# Patient Record
Sex: Female | Born: 1985 | Race: Black or African American | Hispanic: No | Marital: Single | Smoking: Never smoker
Health system: Southern US, Community
[De-identification: ages and names within clinical notes are randomized; demographics above are authoritative.]

## PROBLEM LIST (undated history)

## (undated) DIAGNOSIS — G4730 Sleep apnea, unspecified: Secondary | ICD-10-CM

## (undated) DIAGNOSIS — R569 Unspecified convulsions: Secondary | ICD-10-CM

## (undated) HISTORY — PX: NO PAST SURGERIES: SHX2092

---

## 2008-05-01 ENCOUNTER — Emergency Department (HOSPITAL_COMMUNITY): Admission: EM | Admit: 2008-05-01 | Discharge: 2008-05-01 | Payer: Self-pay | Admitting: Emergency Medicine

## 2008-06-24 ENCOUNTER — Emergency Department (HOSPITAL_COMMUNITY): Admission: EM | Admit: 2008-06-24 | Discharge: 2008-06-24 | Payer: Self-pay | Admitting: Emergency Medicine

## 2008-08-29 ENCOUNTER — Emergency Department (HOSPITAL_COMMUNITY): Admission: EM | Admit: 2008-08-29 | Discharge: 2008-08-29 | Payer: Self-pay | Admitting: Emergency Medicine

## 2008-09-16 ENCOUNTER — Emergency Department (HOSPITAL_COMMUNITY): Admission: EM | Admit: 2008-09-16 | Discharge: 2008-09-16 | Payer: Self-pay | Admitting: Emergency Medicine

## 2008-11-25 ENCOUNTER — Emergency Department (HOSPITAL_COMMUNITY): Admission: EM | Admit: 2008-11-25 | Discharge: 2008-11-26 | Payer: Self-pay | Admitting: Emergency Medicine

## 2010-12-12 LAB — POCT PREGNANCY, URINE: Preg Test, Ur: NEGATIVE

## 2010-12-12 LAB — URINALYSIS, ROUTINE W REFLEX MICROSCOPIC
Bilirubin Urine: NEGATIVE
Hgb urine dipstick: NEGATIVE
Protein, ur: NEGATIVE mg/dL
Urobilinogen, UA: 1 mg/dL (ref 0.0–1.0)

## 2010-12-16 LAB — URINALYSIS, ROUTINE W REFLEX MICROSCOPIC
Bilirubin Urine: NEGATIVE
Glucose, UA: NEGATIVE mg/dL
Ketones, ur: NEGATIVE mg/dL
pH: 7.5 (ref 5.0–8.0)

## 2010-12-16 LAB — POCT PREGNANCY, URINE: Preg Test, Ur: NEGATIVE

## 2010-12-16 LAB — URINE MICROSCOPIC-ADD ON

## 2011-06-06 LAB — CBC
HCT: 41 % (ref 36.0–46.0)
Hemoglobin: 13.9 g/dL (ref 12.0–15.0)
MCHC: 34 g/dL (ref 30.0–36.0)
MCV: 91.6 fL (ref 78.0–100.0)
Platelets: 257 10*3/uL (ref 150–400)
RBC: 4.48 MIL/uL (ref 3.87–5.11)
RDW: 12.4 % (ref 11.5–15.5)
WBC: 7.8 10*3/uL (ref 4.0–10.5)

## 2011-06-06 LAB — DIFFERENTIAL
Basophils Relative: 0 % (ref 0–1)
Lymphocytes Relative: 9 % — ABNORMAL LOW (ref 12–46)
Monocytes Absolute: 0.6 10*3/uL (ref 0.1–1.0)
Monocytes Relative: 7 % (ref 3–12)
Neutro Abs: 5.9 10*3/uL (ref 1.7–7.7)
Neutrophils Relative %: 77 % (ref 43–77)

## 2011-06-06 LAB — COMPREHENSIVE METABOLIC PANEL
Albumin: 3.5 g/dL (ref 3.5–5.2)
Alkaline Phosphatase: 68 U/L (ref 39–117)
BUN: 14 mg/dL (ref 6–23)
Calcium: 9 mg/dL (ref 8.4–10.5)
Creatinine, Ser: 0.73 mg/dL (ref 0.4–1.2)
Glucose, Bld: 116 mg/dL — ABNORMAL HIGH (ref 70–99)
Potassium: 3.9 mEq/L (ref 3.5–5.1)
Total Protein: 6.5 g/dL (ref 6.0–8.3)

## 2013-09-27 ENCOUNTER — Encounter (HOSPITAL_COMMUNITY): Payer: Self-pay | Admitting: Emergency Medicine

## 2013-09-27 ENCOUNTER — Emergency Department (HOSPITAL_COMMUNITY)
Admission: EM | Admit: 2013-09-27 | Discharge: 2013-09-27 | Disposition: A | Discharge status: Home / Self Care | Payer: BC Managed Care – PPO | Attending: Emergency Medicine | Admitting: Emergency Medicine

## 2013-09-27 DIAGNOSIS — Z3202 Encounter for pregnancy test, result negative: Secondary | ICD-10-CM | POA: Insufficient documentation

## 2013-09-27 DIAGNOSIS — K529 Noninfective gastroenteritis and colitis, unspecified: Secondary | ICD-10-CM

## 2013-09-27 DIAGNOSIS — K5289 Other specified noninfective gastroenteritis and colitis: Secondary | ICD-10-CM | POA: Insufficient documentation

## 2013-09-27 DIAGNOSIS — Z8669 Personal history of other diseases of the nervous system and sense organs: Secondary | ICD-10-CM | POA: Insufficient documentation

## 2013-09-27 DIAGNOSIS — G40909 Epilepsy, unspecified, not intractable, without status epilepticus: Secondary | ICD-10-CM | POA: Insufficient documentation

## 2013-09-27 HISTORY — DX: Unspecified convulsions: R56.9

## 2013-09-27 LAB — URINALYSIS, ROUTINE W REFLEX MICROSCOPIC
Bilirubin Urine: NEGATIVE
GLUCOSE, UA: NEGATIVE mg/dL
HGB URINE DIPSTICK: NEGATIVE
Ketones, ur: NEGATIVE mg/dL
LEUKOCYTES UA: NEGATIVE
Nitrite: NEGATIVE
PH: 5.5 (ref 5.0–8.0)
Protein, ur: 30 mg/dL — AB
SPECIFIC GRAVITY, URINE: 1.037 — AB (ref 1.005–1.030)
Urobilinogen, UA: 0.2 mg/dL (ref 0.0–1.0)

## 2013-09-27 LAB — COMPREHENSIVE METABOLIC PANEL
ALBUMIN: 3.7 g/dL (ref 3.5–5.2)
ALT: 10 U/L (ref 0–35)
AST: 14 U/L (ref 0–37)
Alkaline Phosphatase: 105 U/L (ref 39–117)
BUN: 11 mg/dL (ref 6–23)
CALCIUM: 8.4 mg/dL (ref 8.4–10.5)
CO2: 21 meq/L (ref 19–32)
Chloride: 101 mEq/L (ref 96–112)
Creatinine, Ser: 0.91 mg/dL (ref 0.50–1.10)
GFR calc Af Amer: >90 mL/min (ref >90)
GFR calc non Af Amer: 86 mL/min — ABNORMAL LOW (ref >90)
Glucose, Bld: 130 mg/dL — ABNORMAL HIGH (ref 70–99)
Potassium: 3.6 mEq/L — ABNORMAL LOW (ref 3.7–5.3)
SODIUM: 136 meq/L — AB (ref 137–147)
TOTAL PROTEIN: 7.5 g/dL (ref 6.0–8.3)
Total Bilirubin: 0.4 mg/dL (ref 0.3–1.2)

## 2013-09-27 LAB — CBC WITH DIFFERENTIAL/PLATELET
BASOS ABS: 0 10*3/uL (ref 0.0–0.1)
BASOS PCT: 0 % (ref 0–1)
EOS ABS: 0.2 10*3/uL (ref 0.0–0.7)
EOS PCT: 2 % (ref 0–5)
HCT: 40.5 % (ref 36.0–46.0)
Hemoglobin: 13.5 g/dL (ref 12.0–15.0)
LYMPHS PCT: 8 % — AB (ref 12–46)
Lymphs Abs: 0.7 10*3/uL (ref 0.7–4.0)
MCH: 29.1 pg (ref 26.0–34.0)
MCHC: 33.3 g/dL (ref 30.0–36.0)
MCV: 87.3 fL (ref 78.0–100.0)
Monocytes Absolute: 0.6 10*3/uL (ref 0.1–1.0)
Monocytes Relative: 7 % (ref 3–12)
Neutro Abs: 7.5 10*3/uL (ref 1.7–7.7)
Neutrophils Relative %: 83 % — ABNORMAL HIGH (ref 43–77)
PLATELETS: 296 10*3/uL (ref 150–400)
RBC: 4.64 MIL/uL (ref 3.87–5.11)
RDW: 12.8 % (ref 11.5–15.5)
WBC: 9 10*3/uL (ref 4.0–10.5)

## 2013-09-27 LAB — CG4 I-STAT (LACTIC ACID): LACTIC ACID, VENOUS: 1.29 mmol/L (ref 0.5–2.2)

## 2013-09-27 LAB — POCT PREGNANCY, URINE: PREG TEST UR: NEGATIVE

## 2013-09-27 LAB — LIPASE, BLOOD: Lipase: 15 U/L (ref 11–59)

## 2013-09-27 LAB — URINE MICROSCOPIC-ADD ON

## 2013-09-27 MED ORDER — ONDANSETRON 8 MG PO TBDP
8.0000 mg | ORAL_TABLET | Freq: Once | ORAL | Status: AC
  Administered 2013-09-27: 8 mg via ORAL
  Filled 2013-09-27: qty 1

## 2013-09-27 MED ORDER — ONDANSETRON 4 MG PO TBDP: ORAL_TABLET | ORAL | Status: DC

## 2013-09-27 MED ORDER — SODIUM CHLORIDE 0.9 % IV BOLUS (SEPSIS)
1000.0000 mL | Freq: Once | INTRAVENOUS | Status: AC
  Administered 2013-09-27: 1000 mL via INTRAVENOUS

## 2013-09-27 MED ORDER — SODIUM CHLORIDE 0.9 % IV BOLUS (SEPSIS)
1000.0000 mL | Freq: Once | INTRAVENOUS | Status: AC
Start: 2013-09-27 — End: 2013-09-27
  Administered 2013-09-27: 1000 mL via INTRAVENOUS

## 2013-09-27 NOTE — ED Provider Notes (Signed)
CSN: [id number]     Arrival date & time 09/27/13  0057 History   First MD Initiated Contact with Patient 09/27/13 [id number]     Chief Complaint  Patient presents with  . Abdominal Pain  . Emesis   (Consider location/radiation/quality/duration/timing/severity/associated sxs/prior Treatment) Patient is a 28 y.o. female presenting with vomiting. The history is provided by the patient.  Emesis Severity:  Moderate Duration:  2 days Timing:  Intermittent Number of daily episodes:  5-6 Quality:  Stomach contents Progression:  Worsening Chronicity:  New Recent urination:  Decreased Relieved by:  Nothing Worsened by:  Nothing tried Associated symptoms: no abdominal pain, no chills and no cough     Past Medical History  Diagnosis Date  . Seizures    History reviewed. No pertinent past surgical history. History reviewed. No pertinent family history. History  Substance Use Topics  . Smoking status: Never Smoker   . Smokeless tobacco: Never Used  . Alcohol Use: No   OB History   Grav Para Term Preterm Abortions TAB SAB Ect Mult Living                 Review of Systems  Constitutional: Negative for fever and chills.  Respiratory: Negative for cough and shortness of breath.   Gastrointestinal: Negative for vomiting and abdominal pain.  All other systems reviewed and are negative.    Allergies  Review of patient's allergies indicates no known allergies.  Home Medications  No current outpatient prescriptions on file. BP 118/79  Pulse 125  Temp(Src) 98.6 F (37 C) (Oral)  Resp 15  Ht 5\' 6"  (1.676 m)  Wt 230 lb (104.327 kg)  BMI 37.14 kg/m2  SpO2 96%  LMP 09/04/2013 Physical Exam  Nursing note and vitals reviewed. Constitutional: She is oriented to person, place, and time. She appears well-developed and well-nourished. No distress.  HENT:  Head: Normocephalic and atraumatic.  Eyes: EOM are normal. Pupils are equal, round, and reactive to light.  Neck: Normal range of  motion. Neck supple.  Cardiovascular: Normal rate and regular rhythm.  Exam reveals no friction rub.   No murmur heard. Pulmonary/Chest: Effort normal and breath sounds normal. No respiratory distress. She has no wheezes. She has no rales.  Abdominal: Soft. She exhibits no distension. There is no tenderness. There is no rebound.  Musculoskeletal: Normal range of motion. She exhibits no edema.  Neurological: She is alert and oriented to person, place, and time. No cranial nerve deficit. She exhibits normal muscle tone. Coordination normal.  Skin: No rash noted. She is not diaphoretic.    ED Course  Procedures (including critical care time) Labs Review Labs Reviewed  CBC WITH DIFFERENTIAL - Abnormal; Notable for the following:    Neutrophils Relative % 83 (*)    Lymphocytes Relative 8 (*)    All other components within normal limits  COMPREHENSIVE METABOLIC PANEL - Abnormal; Notable for the following:    Sodium 136 (*)    Potassium 3.6 (*)    Glucose, Bld 130 (*)    GFR calc non Af Amer 86 (*)    All other components within normal limits  LIPASE, BLOOD  URINALYSIS, ROUTINE W REFLEX MICROSCOPIC   Imaging Review No results found.  EKG Interpretation   None       MDM   1. Gastroenteritis    21F presents with N/V/D. Sharp, crampy abdominal pain. No blood in vomiting, diarrhea. No fevers. No sick contacts. Hx of seizures, compliant with meds. Afebrile, tachycardic, normotensive.  Clinical picture c/w gastroenteritis, concern for dehydration. Will give fluids.  Elevated specific gravity of urine. Normal other labs. Urinating well here, given Zofran for discharge. Stable to go home, tachycardia resolved.  Dagmar HaitWilliam Anjelo Pullman, MD 09/27/13 [phone]

## 2013-09-27 NOTE — Discharge Instructions (Signed)
Viral Gastroenteritis °Viral gastroenteritis is also known as stomach flu. This condition affects the stomach and intestinal tract. It can cause sudden diarrhea and vomiting. The illness typically lasts 3 to 8 days. Most people develop an immune response that eventually gets rid of the virus. While this natural response develops, the virus can make you quite ill. °CAUSES  °Many different viruses can cause gastroenteritis, such as rotavirus or noroviruses. You can catch one of these viruses by consuming contaminated food or water. You may also catch a virus by sharing utensils or other personal items with an infected person or by touching a contaminated surface. °SYMPTOMS  °The most common symptoms are diarrhea and vomiting. These problems can cause a severe loss of body fluids (dehydration) and a body salt (electrolyte) imbalance. Other symptoms may include: °· Fever. °· Headache. °· Fatigue. °· Abdominal pain. °DIAGNOSIS  °Your caregiver can usually diagnose viral gastroenteritis based on your symptoms and a physical exam. A stool sample may also be taken to test for the presence of viruses or other infections. °TREATMENT  °This illness typically goes away on its own. Treatments are aimed at rehydration. The most serious cases of viral gastroenteritis involve vomiting so severely that you are not able to keep fluids down. In these cases, fluids must be given through an intravenous line (IV). °HOME CARE INSTRUCTIONS  °· Drink enough fluids to keep your urine clear or pale yellow. Drink small amounts of fluids frequently and increase the amounts as tolerated. °· Ask your caregiver for specific rehydration instructions. °· Avoid: °· Foods high in sugar. °· Alcohol. °· Carbonated drinks. °· Tobacco. °· Juice. °· Caffeine drinks. °· Extremely hot or cold fluids. °· Fatty, greasy foods. °· Too much intake of anything at one time. °· Dairy products until 24 to 48 hours after diarrhea stops. °· You may consume probiotics.  Probiotics are active cultures of beneficial bacteria. They may lessen the amount and number of diarrheal stools in adults. Probiotics can be found in yogurt with active cultures and in supplements. °· Wash your hands well to avoid spreading the virus. °· Only take over-the-counter or prescription medicines for pain, discomfort, or fever as directed by your caregiver. Do not give aspirin to children. Antidiarrheal medicines are not recommended. °· Ask your caregiver if you should continue to take your regular prescribed and over-the-counter medicines. °· Keep all follow-up appointments as directed by your caregiver. °SEEK IMMEDIATE MEDICAL CARE IF:  °· You are unable to keep fluids down. °· You do not urinate at least once every 6 to 8 hours. °· You develop shortness of breath. °· You notice blood in your stool or vomit. This may look like coffee grounds. °· You have abdominal pain that increases or is concentrated in one small area (localized). °· You have persistent vomiting or diarrhea. °· You have a fever. °· The patient is a child younger than 3 months, and he or she has a fever. °· The patient is a child older than 3 months, and he or she has a fever and persistent symptoms. °· The patient is a child older than 3 months, and he or she has a fever and symptoms suddenly get worse. °· The patient is a baby, and he or she has no tears when crying. °MAKE SURE YOU:  °· Understand these instructions. °· Will watch your condition. °· Will get help right away if you are not doing well or get worse. °Document Released: 08/18/2005 Document Revised: 11/10/2011 Document Reviewed: 06/04/2011 °  ExitCare Patient Information 2014 [place], [place].   Emergency Department Resource Guide 1) Find a Doctor and Pay Out of Pocket Although you won't have to find out who is covered by your insurance plan, it is a good idea to ask around and get recommendations. You will then need to call the office and see if the doctor you have  chosen will accept you as a new patient and what types of options they offer for patients who are self-pay. Some doctors offer discounts or will set up payment plans for their patients who do not have insurance, but you will need to ask so you aren't surprised when you get to your appointment.  2) Contact Your Local Health Department Not all health departments have doctors that can see patients for sick visits, but many do, so it is worth a call to see if yours does. If you don't know where your local health department is, you can check in your phone book. The CDC also has a tool to help you locate your state's health department, and many state websites also have listings of all of their local health departments.  3) Find a Walk-in Clinic If your illness is not likely to be very severe or complicated, you may want to try a walk in clinic. These are popping up all over the country in pharmacies, drugstores, and shopping centers. They're usually staffed by nurse practitioners or physician assistants that have been trained to treat common illnesses and complaints. They're usually fairly quick and inexpensive. However, if you have serious medical issues or chronic medical problems, these are probably not your best option.  No Primary Care Doctor: - Call Health Connect at  [phone] - they can help you locate a primary care doctor that  accepts your insurance, provides certain services, etc. - Physician Referral Service- [phone]  Chronic Pain Problems: Organization         Address  Phone   Notes  [facility]  [phone] Patients need to be referred by their primary care doctor.   Medication Assistance: Organization         Address  Phone   Notes  [facility] Medication [facility] [address] [place], [place] [address] [phone] --Must be a resident of [facility] -- Must have NO insurance coverage whatsoever (no Medicaid/ Medicare,  etc.) -- The pt. MUST have a primary care doctor that directs their care regularly and follows them in the community   MedAssist  [phone]   [employer]  [phone]    Agencies that provide inexpensive medical care: Organization         Address  Phone   Notes  [facility]  [phone]   [facility]    [phone]   [facility] [address] [place], [place] [address] [phone]) [phone]   [facility] [place] [address], [place] [phone]   Planned Parenthood    [phone](336) [phone]   [facility]    [phone](336) [phone]   [facility]  [address], [place] Phone:  [phone]) [phone], Fax:  [phone]) [phone] Hours of Operation:  9 am - 6 pm, M-F.  Also accepts Medicaid/Medicare and self-pay.  [facility]  [address], [place] Phone: [phone], Fax: [phone]) [phone]. Hours of Operation:  8:30 am - 5:30 pm, M-F.  Also  accepts Medicaid and self-pay.  [facility] [place] [address], [place] Phone: [phone](336) [phone]   [facility] [address], [place], [place] [phone], Ext. [phone] Mondays & Thursdays: 7-9 AM.  First 15 patients are seen on a first come, first serve basis.    Medicaid-accepting [facility] Providers:  Organization         Address  Phone   Notes  [facility] [address], [place] [phone] Also accepts self-pay patients.  [facility] [address], [place]  [phone]) [phone]   [facility] [address], [place] [phone]   [facility] [address] [place] [address], [place] [phone]) [phone]   Renaye RakersVeita Bland [address], [place]   [phone]) [phone] Only accepts [place] Access [place] patients after they have their name applied to their card.   Self-Pay (no  insurance) in [facility] [place]:  Organization         Address  Phone   Notes  Sickle Cell Patients, [facility] [address], [place] [phone]) [phone]   [facility] [address], [place] [phone]   [facility]  [address], [place] [phone]) [phone]   [facility]/Dr. Osei-Bonsu  [address] [place] [address], [place] or [phone], [place] [phone]) [phone] Phone number for both [place] and [place] locations is the same.  [facility] [address], [place] [phone]) [phone]   [facility] [address] [place] [address], [place] or [address] [phone]   [facility] [address], [place] [phone](336) [phone], phone; [phone]) [phone], fax Sees patients 1st and 3rd Saturday of every month.  Must not qualify for public or private insurance (i.e. Medicaid, Medicare, [place] Health Choice, Veterans' Benefits)  Household income should be no more than 200% of the poverty level The clinic cannot treat you if you are pregnant or think you are pregnant  Sexually transmitted diseases are not treated at the clinic.    Dental Care: Organization         Address  Phone  Notes  [facility] Department of [facility] [address], [place] [phone]) [phone] Accepts children up to age 27 who are enrolled in [place]icaid or [place] Health Choice; pregnant women with a Medicaid card; and children who have applied for Medicaid or [place] Health Choice, but were declined, whose parents can pay a reduced fee at time of service.  [facility] Department of [facility] [place]  [address], [place] [phone]) [phone] Accepts children up to age 27 who are enrolled in [place]icaid or [place] Health Choice; pregnant women with a Medicaid card; and children who have applied for Medicaid or [place] Health Choice, but were  declined, whose parents can pay a reduced fee at time of service.  [facility]  [address], [place] [phone]) [phone] Patients are seen by appointment only. Walk-ins are not accepted. [facility] will see patients 27 years of age and older. Monday - Tuesday (8am-5pm) Most Wednesdays (8:30-5pm) $30 per visit, cash only  [facility]  [address], [facility] [phone] Patients are seen by appointment only. Walk-ins are not accepted. [facility] will  see patients 27 years of age and older. One Wednesday Evening (Monthly: Volunteer Based).  $30 per visit, cash only  [employer] [facility]  [phone]) [phone] for adults; Children under age 504, call Graduate Pediatric Dentistry at [phone]. Children aged 514-14, please call [phone]) [phone] to request a pediatric application.  Dental services are provided in all areas of dental care including fillings, crowns and bridges, complete and partial dentures, implants, gum treatment, root canals, and extractions. Preventive care is also provided. Treatment is provided to both adults and children. Patients are selected via a lottery and there is often a waiting list.   [facility] [address], [place]  [phone]) [phone] [email or web address]   [facility] [address], [place], [place] [phone], Ext. [phone] Second and Fourth Thursday of each month, opens at 6:30 AM; Clinic ends at 9 AM.  Patients are seen on a first-come first-served basis, and a limited number are seen during each clinic.   [facility]  [address], [place], [place] [phone]) [phone]   Eligibility Requirements You must have lived in [place], [place], or [place] counties for at least the last three months.   You cannot be eligible for state or federal sponsored National Cityhealthcare insurance, including CIGNAVeterans Administration, [place]icaid, or Harrah[employer] EntertainmentMedicare.   You generally cannot be  eligible for healthcare insurance through your employer.    How to apply: Eligibility screenings are held every Tuesday and Wednesday afternoon from 1:00 pm until 4:00 pm. You do not need an appointment for the interview!  [facility] [address], [place], [place] [phone]   [facility]  [phone]   [facility]  [phone]   [facility]  [phone]    Behavioral Health Resources in the Community: Intensive Outpatient Programs Organization         Address  Phone  Notes  [facility] [address], [place], [place] [phone]   [facility] [address], [place], [place] [phone]   [facility]: [facility] [address], [place], [place]  [phone]   [facility] [address],  [place], [place] [phone] or [phone]   Substance Abuse Resources Organization         Address  Phone  Notes  [facility] Drug Services  [phone]   [facility]  [phone]   [facility]  [phone]   [facility]  [phone]   Residential & Outpatient Substance Abuse Program  [phone]   Psychological Services Organization         Address  Phone  Notes  [facility]  [phone]- [phone]   [facility]  [phone]- [phone]   [facility] [address], [place] [phone] or [phone]    Mobile Crisis Teams Organization         Address  Phone  Notes  [facility], Mobile Crisis Care Unit  [phone]   [facility]  [address] [place], [place] [phone]   Doristine LocksSharon DeEsch [address] [place] [phone]    Self-Help/Support Groups Organization         Address  Phone             Notes  [facility] [place] - variety of support groups  336- [phone] Call for more information    Narcotics Anonymous (NA), [facility] [address], [place]  2 meetings at this location   Residential Treatment Programs Organization         Address  Phone  Notes  [facility] [address],    [place]  [phone]   [facility]  [address], [place] [id number], [place], [place] [phone]   [facility] [address], [place] [phone] Admissions: 8am-3pm M-F  [facility] [address]    [place], [place] [phone]   [facility] [address], [place], [place] [phone]   [facility] [address]  [place], [place] [phone]   [facility] - Intensive Outpatient [address], [place], [place] [phone]   [facility] ([facility] [address]  [place], [place] [phone] or [phone]   [facility] (RTS) [address] [place], [place] [phone] Accepts Medicaid  [facility] [address]  [place] [phone] Substance Abuse/Addiction Treatment   [facility] Behavioral Health Resources Organization         Address  Phone  Notes  [facility]  [phone]) [phone]   Angie FavaJulie Brannon, PhD [address], [address] [place], [place]   [phone] or [phone]   [facility]   [address] [place], [place] [phone]) [phone]   [facility] [address], [place], [place] [phone] Insurance/Medicaid/sponsorship through [facility]  [facility] Families [address]                                    [place], [place] [phone] Therapy/tele-psych/case  [facility] [address].   [place], [place] [phone]) [phone]    Dr. Lolly MustacheArfeen  [phone]) [phone]   [facility]  [employer] [facility] 1) [address], [place] 2) [address], [place] 3)  [address], [place] [phone](336) [phone])  [phone]  [phone]   [facility] Child Abuse Hotline [phone]) [phone] or [phone]) [phone] (After Hours)

## 2013-09-27 NOTE — ED Notes (Signed)
Pt reports abdominal pain with 7 episodes of emesis, which happens every time she tries anything PO, pt also states multiple episodes of diarrhea. Pt a&o x4, ambulatory to triage.

## 2014-02-18 ENCOUNTER — Emergency Department (HOSPITAL_COMMUNITY)
Admission: EM | Admit: 2014-02-18 | Discharge: 2014-02-18 | Disposition: A | Discharge status: Home / Self Care | Payer: BC Managed Care – PPO | Attending: Emergency Medicine | Admitting: Emergency Medicine

## 2014-02-18 ENCOUNTER — Encounter (HOSPITAL_COMMUNITY): Payer: Self-pay | Admitting: Emergency Medicine

## 2014-02-18 DIAGNOSIS — Z760 Encounter for issue of repeat prescription: Secondary | ICD-10-CM | POA: Insufficient documentation

## 2014-02-18 DIAGNOSIS — Z79899 Other long term (current) drug therapy: Secondary | ICD-10-CM | POA: Insufficient documentation

## 2014-02-18 DIAGNOSIS — G40909 Epilepsy, unspecified, not intractable, without status epilepticus: Secondary | ICD-10-CM | POA: Insufficient documentation

## 2014-02-18 MED ORDER — LEVETIRACETAM 500 MG PO TABS
1000.0000 mg | ORAL_TABLET | Freq: Once | ORAL | Status: AC
  Administered 2014-02-18: 1000 mg via ORAL
  Filled 2014-02-18: qty 2

## 2014-02-18 NOTE — ED Provider Notes (Signed)
CSN: [id number]     Arrival date & time 02/18/14  2042 History  This chart was scribed for Philis KendallGail Katana Berthold,NP working with  Audree CamelScott T Goldston, MD by Evon Slackerrance Branch, ED Scribe. This patient was seen in room WTR6/WTR6 and the patient's care was started at 8:52 PM.   Chief Complaint  Patient presents with  . Medication Refill   The history is provided by the patient.   HPI Comments: Brooke Alexander is a 28 y.o. female who presents to the Emergency Department for medication refill. [facility] on the weekends. She states she needs tonight's  dose   She will be able to fill Rx in AM  Past Medical History  Diagnosis Date  . Seizures    History reviewed. No pertinent past surgical history. History reviewed. No pertinent family history. History  Substance Use Topics  . Smoking status: Never Smoker   . Smokeless tobacco: Never Used  . Alcohol Use: No   OB History   Grav Para Term Preterm Abortions TAB SAB Ect Mult Living                 Review of Systems  Neurological: Negative for seizures and headaches.  All other systems reviewed and are negative.     Allergies  Review of patient's allergies indicates no known allergies.  Home Medications   Prior to Admission medications   Medication Sig Start Date End Date Taking? Authorizing Provider  levETIRAcetam (KEPPRA) 500 MG tablet Take 1,000 mg by mouth 2 (two) times daily.    Historical Provider, MD  ondansetron (ZOFRAN ODT) 4 MG disintegrating tablet 4mg  ODT q4 hours prn nausea/vomit 09/27/13   Dagmar HaitWilliam Blair Walden, MD  topiramate (TOPAMAX) 100 MG tablet Take 100 mg by mouth 2 (two) times daily.    Historical Provider, MD   BP 132/74  Pulse 78  Temp(Src) 98.2 F (36.8 C) (Oral)  Resp 18  SpO2 100%  Physical Exam  Vitals reviewed. Constitutional: She appears well-developed and well-nourished.  HENT:  Head: Normocephalic.  Neck: Normal range of motion.  Cardiovascular:  Normal rate.   Musculoskeletal: Normal range of motion.  Neurological: She is alert.  Skin: Skin is warm.  Psychiatric: Her behavior is normal.    ED Course  Procedures (including critical care time)  Labs Review Labs Reviewed - No data to display  Imaging Review No results found.   EKG Interpretation None      MDM  Pateint was given tonight's dose. Final diagnoses:  Medication therapy continued   I personally performed the services described in this documentation, which was scribed in my presence. The recorded information has been reviewed and is accurate.     Arman FilterGail K Sylvio Weatherall, NP 02/18/14 [phone]

## 2014-02-18 NOTE — ED Notes (Signed)
[facility] states [facility] and she needs a dose of her medication tonight. Pt is alert and oriented.

## 2014-02-18 NOTE — Discharge Instructions (Signed)
You were given your PM does of Keppra Please make every effort to fill your prescription in the morning

## 2014-02-18 NOTE — ED Provider Notes (Signed)
Medical screening examination/treatment/procedure(s) were performed by non-physician practitioner and as supervising physician I was immediately available for consultation/collaboration.   EKG Interpretation None        Scott T Goldston, MD 02/18/14 2326 

## 2014-04-07 ENCOUNTER — Emergency Department (HOSPITAL_COMMUNITY): Payer: BC Managed Care – PPO

## 2014-04-07 ENCOUNTER — Encounter (HOSPITAL_COMMUNITY): Payer: Self-pay | Admitting: Emergency Medicine

## 2014-04-07 ENCOUNTER — Emergency Department (HOSPITAL_COMMUNITY)
Admission: EM | Admit: 2014-04-07 | Discharge: 2014-04-08 | Disposition: A | Discharge status: Home / Self Care | Payer: BC Managed Care – PPO | Attending: Emergency Medicine | Admitting: Emergency Medicine

## 2014-04-07 DIAGNOSIS — G40909 Epilepsy, unspecified, not intractable, without status epilepticus: Secondary | ICD-10-CM | POA: Insufficient documentation

## 2014-04-07 DIAGNOSIS — Z79899 Other long term (current) drug therapy: Secondary | ICD-10-CM | POA: Insufficient documentation

## 2014-04-07 DIAGNOSIS — R109 Unspecified abdominal pain: Secondary | ICD-10-CM | POA: Insufficient documentation

## 2014-04-07 DIAGNOSIS — T148XXA Other injury of unspecified body region, initial encounter: Secondary | ICD-10-CM | POA: Insufficient documentation

## 2014-04-07 DIAGNOSIS — Z3202 Encounter for pregnancy test, result negative: Secondary | ICD-10-CM | POA: Insufficient documentation

## 2014-04-07 DIAGNOSIS — R0789 Other chest pain: Secondary | ICD-10-CM | POA: Insufficient documentation

## 2014-04-07 DIAGNOSIS — R079 Chest pain, unspecified: Secondary | ICD-10-CM | POA: Insufficient documentation

## 2014-04-07 DIAGNOSIS — Y929 Unspecified place or not applicable: Secondary | ICD-10-CM | POA: Insufficient documentation

## 2014-04-07 DIAGNOSIS — R51 Headache: Secondary | ICD-10-CM | POA: Insufficient documentation

## 2014-04-07 DIAGNOSIS — Y939 Activity, unspecified: Secondary | ICD-10-CM | POA: Insufficient documentation

## 2014-04-07 DIAGNOSIS — X58XXXA Exposure to other specified factors, initial encounter: Secondary | ICD-10-CM | POA: Insufficient documentation

## 2014-04-07 LAB — CBC WITH DIFFERENTIAL/PLATELET
BASOS ABS: 0 10*3/uL (ref 0.0–0.1)
Basophils Relative: 0 % (ref 0–1)
EOS ABS: 0.3 10*3/uL (ref 0.0–0.7)
Eosinophils Relative: 7 % — ABNORMAL HIGH (ref 0–5)
HCT: 39.7 % (ref 36.0–46.0)
Hemoglobin: 13.1 g/dL (ref 12.0–15.0)
Lymphocytes Relative: 27 % (ref 12–46)
Lymphs Abs: 1.2 10*3/uL (ref 0.7–4.0)
MCH: 28.7 pg (ref 26.0–34.0)
MCHC: 33 g/dL (ref 30.0–36.0)
MCV: 86.9 fL (ref 78.0–100.0)
Monocytes Absolute: 0.5 10*3/uL (ref 0.1–1.0)
Monocytes Relative: 11 % (ref 3–12)
Neutro Abs: 2.5 10*3/uL (ref 1.7–7.7)
Neutrophils Relative %: 55 % (ref 43–77)
PLATELETS: 303 10*3/uL (ref 150–400)
RBC: 4.57 MIL/uL (ref 3.87–5.11)
RDW: 12.6 % (ref 11.5–15.5)
WBC: 4.5 10*3/uL (ref 4.0–10.5)

## 2014-04-07 LAB — URINALYSIS, ROUTINE W REFLEX MICROSCOPIC
Bilirubin Urine: NEGATIVE
GLUCOSE, UA: NEGATIVE mg/dL
HGB URINE DIPSTICK: NEGATIVE
Ketones, ur: NEGATIVE mg/dL
Leukocytes, UA: NEGATIVE
Nitrite: NEGATIVE
PROTEIN: NEGATIVE mg/dL
Specific Gravity, Urine: 1.017 (ref 1.005–1.030)
Urobilinogen, UA: 0.2 mg/dL (ref 0.0–1.0)
pH: 5.5 (ref 5.0–8.0)

## 2014-04-07 LAB — BASIC METABOLIC PANEL
ANION GAP: 13 (ref 5–15)
BUN: 6 mg/dL (ref 6–23)
CALCIUM: 9 mg/dL (ref 8.4–10.5)
CO2: 19 mEq/L (ref 19–32)
Chloride: 102 mEq/L (ref 96–112)
Creatinine, Ser: 0.78 mg/dL (ref 0.50–1.10)
GFR calc Af Amer: >90 mL/min (ref >90)
Glucose, Bld: 91 mg/dL (ref 70–99)
Potassium: 4 mEq/L (ref 3.7–5.3)
SODIUM: 134 meq/L — AB (ref 137–147)

## 2014-04-07 LAB — POC URINE PREG, ED: Preg Test, Ur: NEGATIVE

## 2014-04-07 LAB — TROPONIN I

## 2014-04-07 NOTE — ED Notes (Signed)
Pt reports intermittent L side cp x 1 month.  Pt describes pain as sharp, shooting, and numb.  Pt also reports generalized body pain and L flank pain x 4 days.

## 2014-04-07 NOTE — ED Provider Notes (Signed)
CSN: [id number]     Arrival date & time 04/07/14  2207 History   First MD Initiated Contact with Patient 04/07/14 2257     Chief Complaint  Patient presents with  . Chest Pain  . Flank Pain    Patient is a 28 y.o. female presenting with chest pain and flank pain.  Chest Pain Associated symptoms: back pain and headache   Associated symptoms: no cough, no fever, no nausea, no shortness of breath, not vomiting and no weakness   Flank Pain Associated symptoms include headaches. Pertinent negatives include no chest pain, chills, congestion, coughing, fever, joint swelling, nausea, vomiting or weakness.   Patient with PMH of seizures presents with chest and flank pain. Chest pain started 2 months ago, wax and wanes and is sharp and shooting. Pain is on left side without radiation. Patient has the pain at night and through out the day, on and off and it is relieved by sleep. Pain is not aggravated by food or breathing. Patient denies recent trauma, surgery, DVT or PE, OCP/hormone use. Her flank pain and generalized body ache started 4 days and is aching. It comes and goes. Flank pain is left sided She denies SOB, palpitations, peripheral edema, dysuria, hematuria, N/V/D or abdominal pain or weakness, numbness, tingling, fevers or chills. She works at [employer] and endorses lifting at work. He also has chronic HAs.   Past Medical History  Diagnosis Date  . Seizures    History reviewed. No pertinent past surgical history. No family history on file. History  Substance Use Topics  . Smoking status: Never Smoker   . Smokeless tobacco: Never Used  . Alcohol Use: No   OB History   Grav Para Term Preterm Abortions TAB SAB Ect Mult Living                 Review of Systems  Constitutional: Negative for fever and chills.  HENT: Negative for congestion and rhinorrhea.   Respiratory: Negative for cough and shortness of breath.   Cardiovascular: Negative for chest pain.  Gastrointestinal: Negative  for nausea, vomiting and diarrhea.  Genitourinary: Positive for flank pain. Negative for dysuria, urgency, frequency and hematuria.  Musculoskeletal: Positive for back pain. Negative for gait problem and joint swelling.  Skin: Negative for color change.  Neurological: Positive for headaches. Negative for weakness.      Allergies  Review of patient's allergies indicates no known allergies.  Home Medications   Prior to Admission medications   Medication Sig Start Date End Date Taking? Authorizing Provider  levETIRAcetam (KEPPRA) 500 MG tablet Take 1,000 mg by mouth 2 (two) times daily.   Yes Historical Provider, MD  topiramate (TOPAMAX) 100 MG tablet Take 100 mg by mouth 2 (two) times daily.   Yes Historical Provider, MD   BP 132/82  Pulse 101  Temp(Src) 98.5 F (36.9 C) (Oral)  Resp 20  SpO2 99%  LMP 03/26/2014 Physical Exam  Vitals reviewed. Constitutional: She is oriented to person, place, and time. She appears well-developed and well-nourished. No distress.  HENT:  Head: Normocephalic and atraumatic.  Eyes: Conjunctivae and EOM are normal.  Neck: Normal range of motion. Neck supple.  Cardiovascular: Normal rate, regular rhythm, normal heart sounds and intact distal pulses.   Reproducible left sided chest tenderness without skin changes.  Pulmonary/Chest: Effort normal and breath sounds normal. No respiratory distress. She has no wheezes.  Abdominal: Soft. Bowel sounds are normal. She exhibits no distension. There is no rigidity, no rebound and  no guarding.  RUQ tenderness to palpation. Negative murphy's sign.  Musculoskeletal: Normal range of motion. She exhibits no edema.  Left sided flank pain. Without muscle hypertrophy or skin changes. No CVA tenderness.  Neurological: She is alert and oriented to person, place, and time. She exhibits normal muscle tone.  Skin: Skin is warm and dry. She is not diaphoretic.  Psychiatric: She has a normal mood and affect. Her behavior is  normal.    ED Course  Procedures (including critical care time) Labs Review Labs Reviewed  CBC WITH DIFFERENTIAL - Abnormal; Notable for the following:    Eosinophils Relative 7 (*)    All other components within normal limits  BASIC METABOLIC PANEL - Abnormal; Notable for the following:    Sodium 134 (*)    All other components within normal limits  URINE CULTURE  URINALYSIS, ROUTINE W REFLEX MICROSCOPIC  TROPONIN I  POC URINE PREG, ED    Imaging Review Dg Chest 2 View  04/07/2014   CLINICAL DATA:  Left anterior chest pain.  EXAM: CHEST  2 VIEW  COMPARISON:  None.  FINDINGS: The lungs are well-aerated and clear. There is no evidence of focal opacification, pleural effusion or pneumothorax.  The heart is normal in size; the mediastinal contour is within normal limits. No acute osseous abnormalities are seen.  IMPRESSION: No acute cardiopulmonary process seen.   Electronically Signed   By: Roanna RaiderJeffery  Chang M.D.   On: 04/07/2014 23:25     EKG Interpretation   Date/Time:  Friday April 07 2014 22:38:42 EDT Ventricular Rate:  100 PR Interval:  156 QRS Duration: 86 QT Interval:  333 QTC Calculation: 429 R Axis:   70 Text Interpretation:  Normal sinus rhythm Normal ECG No old tracing to  compare Confirmed by MILLER  MD, BRIAN ([id number]) on 04/07/2014 11:29:29 PM      MDM   Final diagnoses:  Muscle strain  Atypical chest pain   Patient afebrile, tachycardic to 101, non toxic appearing with unremarkable labs. EKG and troponin negative and HEART score of 2, low risk. Patient has no pulmonary symptoms and only tachycardia in [facility]. CXR unremarkable. I doubt emergent cardiopulmonary pathology. Chest pain is most likely musculoskeletal in origin due to reproducible pain on exam, chronic nature and history of lifting. Short course of NSAIDs.  Discussed return precautions with patient. Discussed all results and patient verbalizes understanding and agrees with plan.  This is  a shared patient. This patient was discussed with the physician who saw and evaluated the patient.       Louann SjogrenVictoria L Carnita Golob, PA-C 04/08/14 [phone]

## 2014-04-08 MED ORDER — NAPROXEN 500 MG PO TABS
500.0000 mg | ORAL_TABLET | Freq: Once | ORAL | Status: AC
  Administered 2014-04-08: 500 mg via ORAL
  Filled 2014-04-08: qty 1

## 2014-04-08 NOTE — ED Provider Notes (Signed)
Medical screening examination/treatment/procedure(s) were conducted as a shared visit with non-physician practitioner(s) and myself.  I personally evaluated the patient during the encounter  Please see my separate respective documentation pertaining to this patient encounter   Vida RollerBrian D Melyssa Signor, MD 04/08/14 [phone]

## 2014-04-08 NOTE — ED Provider Notes (Signed)
28 year old overweight female presents with a complaint of intermittent left chest pain over the last month as well as some left lower back pain which has been intermittent over a "long time". She denies dysuria cough fever nausea vomiting diarrhea swelling rashes or any other complaints. The chest pain is not exertional or positional, results spontaneously and is not associated with deep breathing or eating. On exam the patient has clear lung fields, she is not tachycardic on my exam has no peripheral edema and appears well. Her lab workup and EKG and chest x-ray are normal, the patient was informed of these results by myself, she will be discharged in improved and stable condition on an anti-inflammatory to followup with family doctor. The patient is in agreement with the plan.  Medical screening examination/treatment/procedure(s) were conducted as a shared visit with non-physician practitioner(s) and myself.  I personally evaluated the patient during the encounter.  Clinical Impression: Chest pain      Vida RollerBrian D Nikolus Marczak, MD 04/08/14 [phone]

## 2014-04-08 NOTE — Discharge Instructions (Signed)
Return to the emergency room with worsening of symptoms or with symptoms that are concerning. Ibuprofen 800mg  three times a day for one week.

## 2014-04-09 LAB — URINE CULTURE
COLONY COUNT: NO GROWTH
Culture: NO GROWTH

## 2014-09-12 ENCOUNTER — Emergency Department (HOSPITAL_COMMUNITY)
Admission: EM | Admit: 2014-09-12 | Discharge: 2014-09-13 | Disposition: A | Discharge status: Home / Self Care | Payer: Self-pay | Attending: Emergency Medicine | Admitting: Emergency Medicine

## 2014-09-12 ENCOUNTER — Encounter (HOSPITAL_COMMUNITY): Payer: Self-pay | Admitting: *Deleted

## 2014-09-12 DIAGNOSIS — M791 Myalgia: Secondary | ICD-10-CM | POA: Insufficient documentation

## 2014-09-12 DIAGNOSIS — M79601 Pain in right arm: Secondary | ICD-10-CM | POA: Insufficient documentation

## 2014-09-12 DIAGNOSIS — G40909 Epilepsy, unspecified, not intractable, without status epilepticus: Secondary | ICD-10-CM | POA: Insufficient documentation

## 2014-09-12 DIAGNOSIS — Z79899 Other long term (current) drug therapy: Secondary | ICD-10-CM | POA: Insufficient documentation

## 2014-09-12 DIAGNOSIS — R200 Anesthesia of skin: Secondary | ICD-10-CM | POA: Insufficient documentation

## 2014-09-12 MED ORDER — TRAMADOL HCL 50 MG PO TABS: 50.0000 mg | ORAL_TABLET | Freq: Four times a day (QID) | ORAL | Status: DC | PRN

## 2014-09-12 MED ORDER — TRAMADOL HCL 50 MG PO TABS
50.0000 mg | ORAL_TABLET | Freq: Once | ORAL | Status: AC
  Administered 2014-09-12: 50 mg via ORAL
  Filled 2014-09-12: qty 1

## 2014-09-12 MED ORDER — PREDNISONE 20 MG PO TABS: 40.0000 mg | ORAL_TABLET | Freq: Every day | ORAL | Status: DC

## 2014-09-12 MED ORDER — PREDNISONE 20 MG PO TABS
60.0000 mg | ORAL_TABLET | Freq: Once | ORAL | Status: AC
  Administered 2014-09-12: 60 mg via ORAL
  Filled 2014-09-12: qty 3

## 2014-09-12 NOTE — ED Notes (Addendum)
Patient c/o "soreness" to right forearm radiating up to right shoulder x1 week. Denies injury to same. Patient reports intermittent "numbness" of right hand, mostly recently right before coming to ED. Denies visual changes, trouble speaking, other pain. Patient rates soreness 7/10.

## 2014-09-12 NOTE — ED Provider Notes (Signed)
CSN: [id number]     Arrival date & time 09/12/14  2151 History   First MD Initiated Contact with Patient 09/12/14 2338     Chief Complaint  Patient presents with  . Arm Pain     (Consider location/radiation/quality/duration/timing/severity/associated sxs/prior Treatment) HPI Brooke Alexander is a 29 y.o. female with history of seizures, presents to emergency department complaining of right arm pain. Patient states she has had soreness in the right arm for about a week. She reports intermittent tingling in the right hand. Pain is mainly in the forearm but radiates up to the shoulder. Pain with movement of the arm. Denies any recent injuries. She works as a Conservation officer, nature. Denies any pain in the hand. Denies any swelling of the arm. Denies any recent travel or surgeries. She is not a smoker. Does not take OCPs. She denies any prior history of similar pain. No neck pain or injuries. No fever, chills. She has not tried taking nothing for pain. Patient states she is concerned about blood clot or possible nerve injury. Movement of the arm makes the pain worse, nothing makes it better. Denies any other complaints.  Past Medical History  Diagnosis Date  . Seizures    History reviewed. No pertinent past surgical history. No family history on file. History  Substance Use Topics  . Smoking status: Never Smoker   . Smokeless tobacco: Never Used  . Alcohol Use: No   OB History    No data available     Review of Systems  Constitutional: Negative for fever and chills.  Respiratory: Negative for cough, chest tightness and shortness of breath.   Cardiovascular: Negative for chest pain, palpitations and leg swelling.  Musculoskeletal: Positive for myalgias and arthralgias. Negative for joint swelling, neck pain and neck stiffness.  Skin: Negative for rash.  Neurological: Positive for numbness. Negative for dizziness, weakness and headaches.  All other systems reviewed and are negative.     Allergies   Review of patient's allergies indicates no known allergies.  Home Medications   Prior to Admission medications   Medication Sig Start Date End Date Taking? Authorizing Provider  levETIRAcetam (KEPPRA) 500 MG tablet Take 1,000 mg by mouth daily.    Yes Historical Provider, MD  topiramate (TOPAMAX) 100 MG tablet Take 100 mg by mouth daily.    Yes Historical Provider, MD  predniSONE (DELTASONE) 20 MG tablet Take 2 tablets (40 mg total) by mouth daily. 09/12/14   Norvel Wenker A Darrin Koman, PA-C  traMADol (ULTRAM) 50 MG tablet Take 1 tablet (50 mg total) by mouth every 6 (six) hours as needed. 09/12/14   Treyshawn Muldrew A Jaydi Bray, PA-C   BP 140/74 mmHg  Pulse 98  Temp(Src) 98.1 F (36.7 C) (Oral)  Resp 18  SpO2 97%  LMP 08/28/2014 Physical Exam  Constitutional: She appears well-developed and well-nourished. No distress.  HENT:  Head: Normocephalic.  Eyes: Conjunctivae are normal.  Neck: Neck supple.  Cardiovascular: Normal rate, regular rhythm and normal heart sounds.   Pulmonary/Chest: Effort normal and breath sounds normal. No respiratory distress. She has no wheezes. She has no rales.  Musculoskeletal: She exhibits no edema.  Normal-appearing right hand, forearm, upper arm, shoulder. Tenderness to the palpation over anterior right forearm over flexor tendons and muscles. Tenderness over biceps tendon. Pain with range of motion of the shoulder joint injections, full range of motion. Full range of motion of the elbow. Full range of motion of the wrist. No pain with wrist flexion or extension. Grip strength is 5  out of 5 and equal bilaterally. Sensation intact in the upper arm, forearm, hand and all dermatomes. Distal radial pulses intact.  Neurological: She is alert.  Skin: Skin is warm and dry.  Psychiatric: She has a normal mood and affect. Her behavior is normal.  Nursing note and vitals reviewed.   ED Course  Procedures (including critical care time) Labs Review Labs Reviewed - No data to  display  Imaging Review No results found.   EKG Interpretation None      MDM   Final diagnoses:  Right arm pain    Patient with nontraumatic right arm pain. Exam unremarkable other than tenderness of her forearm and upper arm. No neuro deficits on exam. No swelling, bruising, erythema. Although low suspicion, differential and does include DVT. Will schedule her for a venous Doppler in the morning. Unable to get one at this time due to it being at midnight. Another differential includes cervical radiculopathy. We'll start her on prednisone pack. Ultram for pain. Considered carpal tunnel although it is unlikely given her pain is an upper arm and shoulder as well. She refused to splint. We'll have her follow-up with her primary care doctor.  Filed Vitals:   09/12/14 2217  BP: 140/74  Pulse: 98  Temp: 98.1 F (36.7 C)  TempSrc: Oral  Resp: 18  SpO2: 97%       Myriam Jacobsonatyana A Tameko Halder, PA-C 09/13/14 0000  Olivia Mackielga M Otter, MD 09/13/14 [phone]

## 2014-09-12 NOTE — Discharge Instructions (Signed)
[facility]. Report to radiology at 8am. Take prednisone as prescribed until all gone. Ultram for pain. Follow up with primary care doctor. Return if worsening symptoms. No heavy lifting with right arm. Keep it moving. Stretch.   Cervical Radiculopathy Cervical radiculopathy happens when a nerve in the neck is pinched or bruised by a slipped (herniated) disk or by arthritic changes in the bones of the cervical spine. This can occur due to an injury or as part of the normal aging process. Pressure on the cervical nerves can cause pain or numbness that runs from your neck all the way down into your arm and fingers. CAUSES  There are many possible causes, including:  Injury.  Muscle tightness in the neck from overuse.  Swollen, painful joints (arthritis).  Breakdown or degeneration in the bones and joints of the spine (spondylosis) due to aging.  Bone spurs that may develop near the cervical nerves. SYMPTOMS  Symptoms include pain, weakness, or numbness in the affected arm and hand. Pain can be severe or irritating. Symptoms may be worse when extending or turning the neck. DIAGNOSIS  Your caregiver will ask about your symptoms and do a physical exam. He or she may test your strength and reflexes. X-rays, CT scans, and MRI scans may be needed in cases of injury or if the symptoms do not go away after a period of time. Electromyography (EMG) or nerve conduction testing may be done to study how your nerves and muscles are working. TREATMENT  Your caregiver may recommend certain exercises to help relieve your symptoms. Cervical radiculopathy can, and often does, get better with time and treatment. If your problems continue, treatment options may include:  Wearing a soft collar for short periods of time.  Physical therapy to strengthen the neck muscles.  Medicines, such as nonsteroidal anti-inflammatory drugs (NSAIDs), oral corticosteroids, or spinal  injections.  Surgery. Different types of surgery may be done depending on the cause of your problems. HOME CARE INSTRUCTIONS   Put ice on the affected area.  Put ice in a plastic bag.  Place a towel between your skin and the bag.  Leave the ice on for 15-20 minutes, 03-04 times a day or as directed by your caregiver.  If ice does not help, you can try using heat. Take a warm shower or bath, or use a hot water bottle as directed by your caregiver.  You may try a gentle neck and shoulder massage.  Use a flat pillow when you sleep.  Only take over-the-counter or prescription medicines for pain, discomfort, or fever as directed by your caregiver.  If physical therapy was prescribed, follow your caregiver's directions.  If a soft collar was prescribed, use it as directed. SEEK IMMEDIATE MEDICAL CARE IF:   Your pain gets much worse and cannot be controlled with medicines.  You have weakness or numbness in your hand, arm, face, or leg.  You have a high fever or a stiff, rigid neck.  You lose bowel or bladder control (incontinence).  You have trouble with walking, balance, or speaking. MAKE SURE YOU:   Understand these instructions.  Will watch your condition.  Will get help right away if you are not doing well or get worse. Document Released: 05/13/2001 Document Revised: 11/10/2011 Document Reviewed: 04/01/2011 [facility] Patient Information 2015 [place], [place]. This information is not intended to replace advice given to you by your health care provider. Make sure you discuss any questions you have with  your health care provider. ° °

## 2014-09-12 NOTE — ED Notes (Addendum)
Pt reports R FA pain and burning sensation x 1 week-denies injury.  Pt reports her FA/arm feels tight and swollen.  Pt reports tingling in her R fingers but denies numbness.

## 2014-09-13 ENCOUNTER — Ambulatory Visit (HOSPITAL_COMMUNITY)
Admission: RE | Admit: 2014-09-13 | Discharge: 2014-09-13 | Disposition: A | Discharge status: Home / Self Care | Payer: BLUE CROSS/BLUE SHIELD | Source: Ambulatory Visit | Attending: Emergency Medicine | Admitting: Emergency Medicine

## 2014-09-13 DIAGNOSIS — M79601 Pain in right arm: Secondary | ICD-10-CM | POA: Diagnosis present

## 2014-09-13 DIAGNOSIS — M79609 Pain in unspecified limb: Secondary | ICD-10-CM

## 2014-09-13 NOTE — Progress Notes (Signed)
VASCULAR LAB PRELIMINARY  PRELIMINARY  PRELIMINARY  PRELIMINARY  Right upper extremity venous duplex completed.    Preliminary report:  **Right :  No obvious evidence of DVT or superficial thrombosis.    Maelin Kurkowski, RVS 09/13/2014, 12:15 PM

## 2015-01-03 ENCOUNTER — Emergency Department (HOSPITAL_COMMUNITY)
Admission: EM | Admit: 2015-01-03 | Discharge: 2015-01-03 | Disposition: A | Discharge status: Home / Self Care | Payer: BLUE CROSS/BLUE SHIELD | Source: Home / Self Care | Attending: Family Medicine | Admitting: Family Medicine

## 2015-01-03 ENCOUNTER — Encounter (HOSPITAL_COMMUNITY): Payer: Self-pay

## 2015-01-03 DIAGNOSIS — N939 Abnormal uterine and vaginal bleeding, unspecified: Secondary | ICD-10-CM

## 2015-01-03 LAB — POCT I-STAT, CHEM 8
BUN: 11 mg/dL (ref 6–20)
CREATININE: 0.8 mg/dL (ref 0.44–1.00)
Calcium, Ion: 1.22 mmol/L (ref 1.12–1.23)
Chloride: 106 mmol/L (ref 101–111)
GLUCOSE: 110 mg/dL — AB (ref 70–99)
HCT: 45 % (ref 36.0–46.0)
Hemoglobin: 15.3 g/dL — ABNORMAL HIGH (ref 12.0–15.0)
Potassium: 3.6 mmol/L (ref 3.5–5.1)
SODIUM: 142 mmol/L (ref 135–145)
TCO2: 20 mmol/L (ref 0–100)

## 2015-01-03 LAB — CBC
HCT: 41.6 % (ref 36.0–46.0)
Hemoglobin: 13.8 g/dL (ref 12.0–15.0)
MCH: 29.1 pg (ref 26.0–34.0)
MCHC: 33.2 g/dL (ref 30.0–36.0)
MCV: 87.6 fL (ref 78.0–100.0)
Platelets: 302 K/uL (ref 150–400)
RBC: 4.75 MIL/uL (ref 3.87–5.11)
RDW: 12.7 % (ref 11.5–15.5)
WBC: 6 K/uL (ref 4.0–10.5)

## 2015-01-03 LAB — POCT PREGNANCY, URINE: Preg Test, Ur: NEGATIVE

## 2015-01-03 MED ORDER — NORGESTIMATE-ETH ESTRADIOL 0.25-35 MG-MCG PO TABS: 1.0000 | ORAL_TABLET | Freq: Every day | ORAL | Status: DC

## 2015-01-03 NOTE — ED Provider Notes (Signed)
Brooke RutherfordLashea Alexander is a 29 y.o. female who presents to Urgent Care today for heavy vaginal bleeding. Patient has a one-month history of heavy continuous vaginal bleeding. She notes some fatigue. She has not tried any treatment yet. No pelvic pain fevers or chills.   Past Medical History  Diagnosis Date  . Seizures    History reviewed. No pertinent past surgical history. History  Substance Use Topics  . Smoking status: Never Smoker   . Smokeless tobacco: Never Used  . Alcohol Use: No   ROS as above Medications: No current facility-administered medications for this encounter.   Current Outpatient Prescriptions  Medication Sig Dispense Refill  . levETIRAcetam (KEPPRA) 500 MG tablet Take 1,000 mg by mouth daily.     Marland Kitchen. topiramate (TOPAMAX) 100 MG tablet Take 100 mg by mouth daily.     . norgestimate-ethinyl estradiol (SPRINTEC 28) 0.25-35 MG-MCG tablet Take 1 tablet by mouth daily. 1 Package 1  . predniSONE (DELTASONE) 20 MG tablet Take 2 tablets (40 mg total) by mouth daily. 10 tablet 0  . traMADol (ULTRAM) 50 MG tablet Take 1 tablet (50 mg total) by mouth every 6 (six) hours as needed. 15 tablet 0   No Known Allergies   Exam:  BP 114/73 mmHg  Pulse 88  Temp(Src) 97.4 F (36.3 C) (Oral)  Resp 12  SpO2 98% Gen: Well NAD HEENT: EOMI,  MMM Lungs: Normal work of breathing. CTABL Heart: RRR no MRG Abd: NABS, Soft. Nondistended, Nontender no masses palpated Exts: Brisk capillary refill, warm and well perfused.   Results for orders placed or performed during the hospital encounter of 01/03/15 (from the past 24 hour(s))  Pregnancy, urine POC     Status: None   Collection Time: 01/03/15  1:01 PM  Result Value Ref Range   Preg Test, Ur NEGATIVE NEGATIVE  I-STAT, chem 8     Status: Abnormal   Collection Time: 01/03/15  1:02 PM  Result Value Ref Range   Sodium 142 135 - 145 mmol/L   Potassium 3.6 3.5 - 5.1 mmol/L   Chloride 106 101 - 111 mmol/L   BUN 11 6 - 20 mg/dL   Creatinine,  Ser 6.570.80 0.44 - 1.00 mg/dL   Glucose, Bld 846110 (H) 70 - 99 mg/dL   Calcium, Ion 9.621.22 9.521.12 - 1.23 mmol/L   TCO2 20 0 - 100 mmol/L   Hemoglobin 15.3 (H) 12.0 - 15.0 g/dL   HCT 84.145.0 32.436.0 - 40.146.0 %   No results found.  Assessment and Plan: 29 y.o. female with heavy vaginal bleeding. Start Sprintec follow-up with OB/GYN.  Discussed warning signs or symptoms. Please see discharge instructions. Patient expresses understanding.     Rodolph BongEvan S Shad Ledvina, MD 01/03/15 1324

## 2015-01-03 NOTE — Discharge Instructions (Signed)
Thank you for coming in today. Start Sprintec. Follow-up with OB/GYN. If your belly pain worsens, or you have high fever, bad vomiting, blood in your stool or black tarry stool go to the Emergency Room.    Abnormal Uterine Bleeding Abnormal uterine bleeding can affect women at various stages in life, including teenagers, women in their reproductive years, pregnant women, and women who have reached menopause. Several kinds of uterine bleeding are considered abnormal, including:  Bleeding or spotting between periods.   Bleeding after sexual intercourse.   Bleeding that is heavier or more than normal.   Periods that last longer than usual.  Bleeding after menopause.  Many cases of abnormal uterine bleeding are minor and simple to treat, while others are more serious. Any type of abnormal bleeding should be evaluated by your health care provider. Treatment will depend on the cause of the bleeding. HOME CARE INSTRUCTIONS Monitor your condition for any changes. The following actions may help to alleviate any discomfort you are experiencing:  Avoid the use of tampons and douches as directed by your health care provider.  Change your pads frequently. You should get regular pelvic exams and Pap tests. Keep all follow-up appointments for diagnostic tests as directed by your health care provider.  SEEK MEDICAL CARE IF:   Your bleeding lasts more than 1 week.   You feel dizzy at times.  SEEK IMMEDIATE MEDICAL CARE IF:   You pass out.   You are changing pads every 15 to 30 minutes.   You have abdominal pain.  You have a fever.   You become sweaty or weak.   You are passing large blood clots from the vagina.   You start to feel nauseous and vomit. MAKE SURE YOU:   Understand these instructions.  Will watch your condition.  Will get help right away if you are not doing well or get worse. Document Released: 08/18/2005 Document Revised: 08/23/2013 Document Reviewed:  03/17/2013 [facility] Patient Information 2015 [place] BeachExitCare, [place]LL[place]. This information is not intended to replace advice given to you by your health care provider. Make sure you discuss any questions you have with your health care provider.  [facility] [address] [place], [place] [phone]  [facility] [address] [place], [place] [phone]  [facility]: Silverio Layivard Sandra MD [address] [place], [place] [phone]  [facility] [address] [place], [place] [phone]  [facility]: Marcelle OverlieGrewal Michelle MD [address] [place], [place] [phone]  [facility]: Tracey HarriesHenley Thomas F MD [address] , [place] [phone]  [facility] [address] [place], [place] [phone]

## 2015-01-03 NOTE — ED Notes (Signed)
States her menses has lasted for a month. Frequent pad changes , heavy bleeding w clots. Motrin for pain

## 2015-07-29 ENCOUNTER — Emergency Department (HOSPITAL_COMMUNITY)
Admission: EM | Admit: 2015-07-29 | Discharge: 2015-07-29 | Discharge status: Against Medical Advice | Payer: BLUE CROSS/BLUE SHIELD | Attending: Emergency Medicine | Admitting: Emergency Medicine

## 2015-07-29 ENCOUNTER — Encounter (HOSPITAL_COMMUNITY): Payer: Self-pay | Admitting: Emergency Medicine

## 2015-07-29 DIAGNOSIS — R202 Paresthesia of skin: Secondary | ICD-10-CM | POA: Insufficient documentation

## 2015-07-29 DIAGNOSIS — R51 Headache: Secondary | ICD-10-CM | POA: Insufficient documentation

## 2015-07-29 NOTE — ED Notes (Signed)
Pt presents with HA and "tingling" to back of head x 3 days. Pt states he had brief episode of cloudy vision several days ago. Pt also L chest pain, non radiating, describes as numb feeling.  Seizure's controlled with medication x 3 years however mother and patient are concerned feeling s/s maybe pre curser to event.

## 2016-01-14 ENCOUNTER — Encounter (HOSPITAL_COMMUNITY): Payer: Self-pay | Admitting: Emergency Medicine

## 2016-01-14 ENCOUNTER — Emergency Department (HOSPITAL_COMMUNITY)
Admission: EM | Admit: 2016-01-14 | Discharge: 2016-01-14 | Disposition: A | Discharge status: Home / Self Care | Payer: BLUE CROSS/BLUE SHIELD | Attending: Emergency Medicine | Admitting: Emergency Medicine

## 2016-01-14 DIAGNOSIS — Z79891 Long term (current) use of opiate analgesic: Secondary | ICD-10-CM | POA: Insufficient documentation

## 2016-01-14 DIAGNOSIS — J029 Acute pharyngitis, unspecified: Secondary | ICD-10-CM | POA: Insufficient documentation

## 2016-01-14 DIAGNOSIS — R059 Cough, unspecified: Secondary | ICD-10-CM

## 2016-01-14 DIAGNOSIS — Z7952 Long term (current) use of systemic steroids: Secondary | ICD-10-CM | POA: Insufficient documentation

## 2016-01-14 DIAGNOSIS — R05 Cough: Secondary | ICD-10-CM | POA: Insufficient documentation

## 2016-01-14 DIAGNOSIS — R6889 Other general symptoms and signs: Secondary | ICD-10-CM

## 2016-01-14 DIAGNOSIS — Z79899 Other long term (current) drug therapy: Secondary | ICD-10-CM | POA: Diagnosis not present

## 2016-01-14 LAB — RAPID STREP SCREEN (MED CTR MEBANE ONLY): Streptococcus, Group A Screen (Direct): NEGATIVE

## 2016-01-14 MED ORDER — OSELTAMIVIR PHOSPHATE 75 MG PO CAPS: 75.0000 mg | ORAL_CAPSULE | Freq: Two times a day (BID) | ORAL | Status: DC

## 2016-01-14 NOTE — ED Provider Notes (Signed)
CSN: [id number]     Arrival date & time 01/14/16  1044 History  By signing my name below, I, Tanda Rockers, attest that this documentation has been prepared under the direction and in the presence of [address], [employer]. Electronically Signed: Tanda Rockers, ED Scribe. 01/14/2016. 12:43 PM.   Chief Complaint  Patient presents with  . Sore Throat   Patient is a 30 y.o. female presenting with pharyngitis. The history is provided by the patient. No language interpreter was used.  Sore Throat This is a new problem. The current episode started yesterday. The problem occurs rarely. The problem has not changed since onset.Pertinent negatives include no chest pain, no abdominal pain, no headaches and no shortness of breath. The symptoms are aggravated by swallowing. Nothing relieves the symptoms. Treatments tried: Zyrtec. The treatment provided no relief.    HPI Comments: Brooke Alexander is a 30 y.o. female who presents to the Emergency Department complaining of gradual onset, constant, 7/10, sore throat that began yesterday. She is able to tolerate her own secretions. Pt also complains of nasal congestion, post nasal drip, body aches, subjective fever, chills, intermittent watery red eyes, yellow rhinorrhea, and dry cough. There are no modifying factors to her symptoms. Pt has hx of seasonal allergies and took Zyrtec yesterday without relief. No recent sick contact or recent foreign travel.  Denies ear pain, ear drainage, drooling, trismus, chest pain, shortness of breath, wheezing, abdominal pain, nausea, vomiting, diarrhea, constipation, dysuria, hematuria, weakness, numbness, tingling, or any other associated symptoms.   PCP - None  Past Medical History  Diagnosis Date  . Seizures (HCC)    History reviewed. No pertinent past surgical history. History reviewed. No pertinent family history. Social History  Substance Use Topics  . Smoking status: Never Smoker   . Smokeless tobacco: Never Used   . Alcohol Use: No   OB History    No data available     Review of Systems  Constitutional: Positive for fever (subjective) and chills.  HENT: Positive for congestion, postnasal drip, rhinorrhea, sinus pressure and sore throat. Negative for drooling, ear discharge, ear pain and trouble swallowing.   Eyes: Positive for discharge (watery) and redness (occasionally).  Respiratory: Positive for cough. Negative for shortness of breath and wheezing.   Cardiovascular: Negative for chest pain.  Gastrointestinal: Negative for nausea, vomiting, abdominal pain, diarrhea and constipation.  Genitourinary: Negative for dysuria and hematuria.  Musculoskeletal: Positive for myalgias (generalized).  Skin: Negative for color change.  Allergic/Immunologic: Positive for environmental allergies. Negative for immunocompromised state.  Neurological: Negative for weakness, numbness and headaches.  Psychiatric/Behavioral: Negative for confusion.  A complete 10 system review of systems was obtained and all systems are negative except as noted in the HPI and PMH.   Allergies  Review of patient's allergies indicates no known allergies.  Home Medications   Prior to Admission medications   Medication Sig Start Date End Date Taking? Authorizing Provider  levETIRAcetam (KEPPRA) 500 MG tablet Take 1,000 mg by mouth daily.     Historical Provider, MD  norgestimate-ethinyl estradiol (SPRINTEC 28) 0.25-35 MG-MCG tablet Take 1 tablet by mouth daily. 01/03/15   Rodolph Bong, MD  predniSONE (DELTASONE) 20 MG tablet Take 2 tablets (40 mg total) by mouth daily. 09/12/14   Tatyana Kirichenko, PA-C  topiramate (TOPAMAX) 100 MG tablet Take 100 mg by mouth daily.     Historical Provider, MD  traMADol (ULTRAM) 50 MG tablet Take 1 tablet (50 mg total) by mouth every 6 (six) hours  as needed. 09/12/14   Tatyana Kirichenko, PA-C   BP 129/86 mmHg  Pulse 100  Temp(Src) 97.9 F (36.6 C) (Oral)  Resp 17  SpO2 99%  LMP 01/07/2016    Physical Exam  Constitutional: She is oriented to person, place, and time. Vital signs are normal. She appears well-developed and well-nourished.  Non-toxic appearance. No distress.  Afebrile, nontoxic, NAD  HENT:  Head: Normocephalic and atraumatic.  Right Ear: Hearing, tympanic membrane, external ear and ear canal normal.  Left Ear: Hearing, tympanic membrane, external ear and ear canal normal.  Nose: Mucosal edema and rhinorrhea present.  Mouth/Throat: Uvula is midline and mucous membranes are normal. No trismus in the jaw. No uvula swelling. Posterior oropharyngeal erythema present. No oropharyngeal exudate, posterior oropharyngeal edema or tonsillar abscesses.  Ears are clear bilaterally. Nose with mild mucosal edema and rhinorrhea. Oropharynx with mild erythema, without uvular swelling or deviation, no trismus or drooling, no tonsillar swelling or exudates. No PTA.   Eyes: Conjunctivae and EOM are normal. Right eye exhibits no discharge. Left eye exhibits no discharge.  Neck: Normal range of motion. Neck supple.  Cardiovascular: Normal rate, regular rhythm, normal heart sounds and intact distal pulses.  Exam reveals no gallop and no friction rub.   No murmur heard. Pulmonary/Chest: Effort normal and breath sounds normal. No respiratory distress. She has no decreased breath sounds. She has no wheezes. She has no rhonchi. She has no rales.  Abdominal: Soft. Normal appearance and bowel sounds are normal. She exhibits no distension. There is no tenderness. There is no rigidity, no rebound, no guarding, no CVA tenderness, no tenderness at McBurney's point and negative Murphy's sign.  Musculoskeletal: Normal range of motion.  Neurological: She is alert and oriented to person, place, and time. She has normal strength. No sensory deficit.  Skin: Skin is warm, dry and intact. No rash noted.  Psychiatric: She has a normal mood and affect.  Nursing note and vitals reviewed.   ED Course   Procedures (including critical care time)  DIAGNOSTIC STUDIES: Oxygen Saturation is 99% on RA, normal by my interpretation.    COORDINATION OF CARE: 12:31 PM-Discussed treatment plan which includes Rx Tamiflu and follow up with a PCP in 1-2 weeks with pt at bedside and pt agreed to plan.   Labs Review Labs Reviewed  RAPID STREP SCREEN (NOT AT [facility])  CULTURE, GROUP A STREP [facility])   I have personally reviewed and evaluated these lab results as part of my medical decision-making.  MDM   Final diagnoses:  Flu-like symptoms  Sore throat  Cough    30 y.o. female here with flu like sxs x1 day. Strep test sent in triage, which was neg. Pt is afebrile with a clear lung exam. Mild rhinorrhea. Likely viral URI, possibly flu, will treat empirically for flu. Pt is agreeable to symptomatic treatment and Tamiflu with close follow up with PCP as needed but spoke at length about emergent changing or worsening of symptoms that should prompt return to ER. Pt voices understanding and is agreeable to plan. Stable at time of discharge.   I personally performed the services described in this documentation, which was scribed in my presence. The recorded information has been reviewed and is accurate.   BP 129/86 mmHg  Pulse 100  Temp(Src) 97.9 F (36.6 C) (Oral)  Resp 17  SpO2 99%  LMP 01/07/2016  Meds ordered this encounter  Medications  . oseltamivir (TAMIFLU) 75 MG capsule    Sig: Take 1 capsule (  75 mg total) by mouth every 12 (twelve) hours.    Dispense:  10 capsule    Refill:  0    Order Specific Question:  Supervising Provider    Answer:  Eber HongMILLER, BRIAN [[id number]]        Octivia Canion Camprubi-Soms, PA-C 01/14/16 1249  Richardean Canalavid H Yao, MD 01/14/16 1606

## 2016-01-14 NOTE — ED Notes (Signed)
Pt c/o sore throat and nasal congestion with dry cough since Sunday. Reports generalized body aches and fever. Pt reports taking Zyrtec yesterday for her seasonal allergies.

## 2016-01-14 NOTE — ED Notes (Signed)
PA at bedside.

## 2016-01-14 NOTE — Discharge Instructions (Signed)
Continue to stay well-hydrated. Gargle warm salt water and spit it out. Use chloraseptic spray as needed for sore throat. Continue to alternate between Tylenol and Ibuprofen for pain or fever. Use Mucinex for cough suppression/expectoration of mucus. Use netipot and flonase to help with nasal congestion. May consider over-the-counter Benadryl or other antihistamine to decrease secretions and for watery itchy eyes. Take tamiflu as directed. Followup with your primary care doctor from your insurance carrier's website in 5-7 days for recheck of ongoing symptoms and to establish care. Return to emergency department for emergent changing or worsening of symptoms.   Viral Infections A viral infection can be caused by different types of viruses.Most viral infections are not serious and resolve on their own. However, some infections may cause severe symptoms and may lead to further complications. SYMPTOMS Viruses can frequently cause:  Minor sore throat.  Aches and pains.  Headaches.  Runny nose.  Different types of rashes.  Watery eyes.  Tiredness.  Cough.  Loss of appetite.  Gastrointestinal infections, resulting in nausea, vomiting, and diarrhea. These symptoms do not respond to antibiotics because the infection is not caused by bacteria. However, you might catch a bacterial infection following the viral infection. This is sometimes called a "superinfection." Symptoms of such a bacterial infection may include:  Worsening sore throat with pus and difficulty swallowing.  Swollen neck glands.  Chills and a high or persistent fever.  Severe headache.  Tenderness over the sinuses.  Persistent overall ill feeling (malaise), muscle aches, and tiredness (fatigue).  Persistent cough.  Yellow, green, or brown mucus production with coughing. HOME CARE INSTRUCTIONS   Only take over-the-counter or prescription medicines for pain, discomfort, diarrhea, or fever as directed by your  caregiver.  Drink enough water and fluids to keep your urine clear or pale yellow. Sports drinks can provide valuable electrolytes, sugars, and hydration.  Get plenty of rest and maintain proper nutrition. Soups and broths with crackers or rice are fine. SEEK IMMEDIATE MEDICAL CARE IF:   You have severe headaches, shortness of breath, chest pain, neck pain, or an unusual rash.  You have uncontrolled vomiting, diarrhea, or you are unable to keep down fluids.  You or your child has an oral temperature above 102 F (38.9 C), not controlled by medicine.  Your baby is older than 3 months with a rectal temperature of 102 F (38.9 C) or higher.  Your baby is 86 months old or younger with a rectal temperature of 100.4 F (38 C) or higher. MAKE SURE YOU:   Understand these instructions.  Will watch your condition.  Will get help right away if you are not doing well or get worse.   This information is not intended to replace advice given to you by your health care provider. Make sure you discuss any questions you have with your health care provider.   Document Released: 05/28/2005 Document Revised: 11/10/2011 Document Reviewed: 01/24/2015 Elsevier Interactive Patient Education 2016 Elsevier Inc.  Sore Throat A sore throat is pain, burning, irritation, or scratchiness of the throat. There is often pain or tenderness when swallowing or talking. A sore throat may be accompanied by other symptoms, such as coughing, sneezing, fever, and swollen neck glands. A sore throat is often the first sign of another sickness, such as a cold, flu, strep throat, or mononucleosis (commonly known as mono). Most sore throats go away without medical treatment. CAUSES  The most common causes of a sore throat include:  A viral infection, such as a  cold, flu, or mono.  A bacterial infection, such as strep throat, tonsillitis, or whooping cough.  Seasonal allergies.  Dryness in the air.  Irritants, such as  smoke or pollution.  Gastroesophageal reflux disease (GERD). HOME CARE INSTRUCTIONS   Only take over-the-counter medicines as directed by your caregiver.  Drink enough fluids to keep your urine clear or pale yellow.  Rest as needed.  Try using throat sprays, lozenges, or sucking on hard candy to ease any pain (if older than 4 years or as directed).  Sip warm liquids, such as broth, herbal tea, or warm water with honey to relieve pain temporarily. You may also eat or drink cold or frozen liquids such as frozen ice pops.  Gargle with salt water (mix 1 tsp salt with 8 oz of water).  Do not smoke and avoid secondhand smoke.  Put a cool-mist humidifier in your bedroom at night to moisten the air. You can also turn on a hot shower and sit in the bathroom with the door closed for 5-10 minutes. SEEK IMMEDIATE MEDICAL CARE IF:  You have difficulty breathing.  You are unable to swallow fluids, soft foods, or your saliva.  You have increased swelling in the throat.  Your sore throat does not get better in 7 days.  You have nausea and vomiting.  You have a fever or persistent symptoms for more than 2-3 days.  You have a fever and your symptoms suddenly get worse. MAKE SURE YOU:   Understand these instructions.  Will watch your condition.  Will get help right away if you are not doing well or get worse.   This information is not intended to replace advice given to you by your health care provider. Make sure you discuss any questions you have with your health care provider.   Document Released: 09/25/2004 Document Revised: 09/08/2014 Document Reviewed: 04/25/2012 Elsevier Interactive Patient Education 2016 Elsevier Inc.  Cough, Adult A cough helps to clear your throat and lungs. A cough may last only 2-3 weeks (acute), or it may last longer than 8 weeks (chronic). Many different things can cause a cough. A cough may be a sign of an illness or another medical condition. HOME  CARE  Pay attention to any changes in your cough.  Take medicines only as told by your doctor.  If you were prescribed an antibiotic medicine, take it as told by your doctor. Do not stop taking it even if you start to feel better.  Talk with your doctor before you try using a cough medicine.  Drink enough fluid to keep your pee (urine) clear or pale yellow.  If the air is dry, use a cold steam vaporizer or humidifier in your home.  Stay away from things that make you cough at work or at home.  If your cough is worse at night, try using extra pillows to raise your head up higher while you sleep.  Do not smoke, and try not to be around smoke. If you need help quitting, ask your doctor.  Do not have caffeine.  Do not drink alcohol.  Rest as needed. GET HELP IF:  You have new problems (symptoms).  You cough up yellow fluid (pus).  Your cough does not get better after 2-3 weeks, or your cough gets worse.  Medicine does not help your cough and you are not sleeping well.  You have pain that gets worse or pain that is not helped with medicine.  You have a fever.  You are  losing weight and you do not know why.  You have night sweats. GET HELP RIGHT AWAY IF:  You cough up blood.  You have trouble breathing.  Your heartbeat is very fast.   This information is not intended to replace advice given to you by your health care provider. Make sure you discuss any questions you have with your health care provider.   Document Released: 05/01/2011 Document Revised: 05/09/2015 Document Reviewed: 10/25/2014 Elsevier Interactive Patient Education [employer]2016 Elsevier Inc.

## 2016-01-16 LAB — CULTURE, GROUP A STREP (THRC)

## 2016-08-17 ENCOUNTER — Encounter (HOSPITAL_COMMUNITY): Payer: Self-pay | Admitting: Oncology

## 2016-08-17 ENCOUNTER — Emergency Department (HOSPITAL_COMMUNITY)
Admission: EM | Admit: 2016-08-17 | Discharge: 2016-08-17 | Disposition: A | Discharge status: Home / Self Care | Payer: BLUE CROSS/BLUE SHIELD | Attending: Emergency Medicine | Admitting: Emergency Medicine

## 2016-08-17 DIAGNOSIS — T426X6A Underdosing of other antiepileptic and sedative-hypnotic drugs, initial encounter: Secondary | ICD-10-CM | POA: Diagnosis not present

## 2016-08-17 DIAGNOSIS — Z9114 Patient's other noncompliance with medication regimen: Secondary | ICD-10-CM | POA: Insufficient documentation

## 2016-08-17 DIAGNOSIS — R569 Unspecified convulsions: Secondary | ICD-10-CM | POA: Diagnosis present

## 2016-08-17 LAB — COMPREHENSIVE METABOLIC PANEL
ALBUMIN: 3.9 g/dL (ref 3.5–5.0)
ALT: 16 U/L (ref 14–54)
AST: 22 U/L (ref 15–41)
Alkaline Phosphatase: 73 U/L (ref 38–126)
Anion gap: 5 (ref 5–15)
BUN: 13 mg/dL (ref 6–20)
CHLORIDE: 108 mmol/L (ref 101–111)
CO2: 22 mmol/L (ref 22–32)
CREATININE: 0.7 mg/dL (ref 0.44–1.00)
Calcium: 8.6 mg/dL — ABNORMAL LOW (ref 8.9–10.3)
GFR calc Af Amer: >60 mL/min (ref >60)
GLUCOSE: 122 mg/dL — AB (ref 65–99)
POTASSIUM: 3.8 mmol/L (ref 3.5–5.1)
SODIUM: 135 mmol/L (ref 135–145)
Total Bilirubin: 0.4 mg/dL (ref 0.3–1.2)
Total Protein: 7 g/dL (ref 6.5–8.1)

## 2016-08-17 LAB — CBC WITH DIFFERENTIAL/PLATELET
BASOS ABS: 0 10*3/uL (ref 0.0–0.1)
BASOS PCT: 1 %
EOS PCT: 7 %
Eosinophils Absolute: 0.5 10*3/uL (ref 0.0–0.7)
HCT: 37.9 % (ref 36.0–46.0)
Hemoglobin: 13.2 g/dL (ref 12.0–15.0)
LYMPHS PCT: 16 %
Lymphs Abs: 1.2 10*3/uL (ref 0.7–4.0)
MCH: 29.5 pg (ref 26.0–34.0)
MCHC: 34.8 g/dL (ref 30.0–36.0)
MCV: 84.8 fL (ref 78.0–100.0)
Monocytes Absolute: 0.6 10*3/uL (ref 0.1–1.0)
Monocytes Relative: 8 %
Neutro Abs: 5.3 10*3/uL (ref 1.7–7.7)
Neutrophils Relative %: 68 %
PLATELETS: 282 10*3/uL (ref 150–400)
RBC: 4.47 MIL/uL (ref 3.87–5.11)
RDW: 12.6 % (ref 11.5–15.5)
WBC: 7.8 10*3/uL (ref 4.0–10.5)

## 2016-08-17 LAB — CBG MONITORING, ED: GLUCOSE-CAPILLARY: 113 mg/dL — AB (ref 65–99)

## 2016-08-17 MED ORDER — LEVETIRACETAM 500 MG PO TABS
1000.0000 mg | ORAL_TABLET | Freq: Once | ORAL | Status: AC
  Administered 2016-08-17: 1000 mg via ORAL
  Filled 2016-08-17: qty 2

## 2016-08-17 NOTE — ED Triage Notes (Signed)
Pt has hx of seizure.  Last seizure 4 year ago.  Pt forgot to take her keppra x 2 days.  Pt had an unwitnessed seizure this evening.  Upon EMS arrival pt was not post ictal.  Per EMS pt states pt is shaken up by recent seizure.  Pt is A&O x 4.

## 2016-08-17 NOTE — ED Notes (Signed)
Family at bedside: mom at bedside

## 2016-08-17 NOTE — ED Provider Notes (Signed)
[facility]-EMERGENCY DEPT Provider Note   CSN: [id number] Arrival date & time: 08/17/16  0156     History   Chief Complaint Chief Complaint  Patient presents with  . Seizures    HPI Brooke Alexander is a 30 y.o. female.  Strain 30 year old female with a history of seizures since elementary school states that she missed 2 doses of Keppra she has refills but every time she went to the pharmacy they were closed she did take her Topamax on a regular basis unwitnessed seizure tonight her only complaint is abdominal discomfort and a sore tongue. Other states that she stumbled into a room it was slightly confused so she knew she has a seizure this lasted approximately 15 minutes, there was no loss of bowel or bladder      Past Medical History:  Diagnosis Date  . Seizures (HCC)     There are no active problems to display for this patient.   History reviewed. No pertinent surgical history.  OB History    No data available       Home Medications    Prior to Admission medications   Medication Sig Start Date End Date Taking? Authorizing Provider  levETIRAcetam (KEPPRA) 500 MG tablet Take 1,000 mg by mouth every evening.    Yes Historical Provider, MD  topiramate (TOPAMAX) 100 MG tablet Take 100 mg by mouth every evening.    Yes Historical Provider, MD  norgestimate-ethinyl estradiol (SPRINTEC 28) 0.25-35 MG-MCG tablet Take 1 tablet by mouth daily. Patient not taking: Reported on 08/17/2016 01/03/15   Rodolph BongEvan S Corey, MD    Family History No family history on file.  Social History Social History  Substance Use Topics  . Smoking status: Never Smoker  . Smokeless tobacco: Never Used  . Alcohol use No     Allergies   Patient has no known allergies.   Review of Systems Review of Systems  Constitutional: Negative for fever.  HENT: Positive for mouth sores.   Gastrointestinal: Positive for abdominal pain.  Neurological: Negative for dizziness and headaches.    Psychiatric/Behavioral: Negative for confusion.  All other systems reviewed and are negative.    Physical Exam Updated Vital Signs BP 114/63   Pulse 90   Temp 98.4 F (36.9 C) (Oral)   Resp 15   LMP 08/03/2016 (Approximate)   SpO2 96%   Physical Exam  Constitutional: She is oriented to person, place, and time. She appears well-developed and well-nourished. No distress.  HENT:  Mouth/Throat:    Eyes: Pupils are equal, round, and reactive to light.  Neck: Normal range of motion.  Cardiovascular: Normal rate and regular rhythm.   Pulmonary/Chest: Effort normal and breath sounds normal.  Abdominal: Soft. Bowel sounds are normal. She exhibits no distension. There is no tenderness.  Musculoskeletal: Normal range of motion.  Neurological: She is alert and oriented to person, place, and time.  Skin: Skin is warm.  Psychiatric: She has a normal mood and affect.  Nursing note and vitals reviewed.    ED Treatments / Results  Labs (all labs ordered are listed, but only abnormal results are displayed) Labs Reviewed  COMPREHENSIVE METABOLIC PANEL - Abnormal; Notable for the following:       Result Value   Glucose, Bld 122 (*)    Calcium 8.6 (*)    All other components within normal limits  CBG MONITORING, ED - Abnormal; Notable for the following:    Glucose-Capillary 113 (*)    All other components within normal limits  CBC WITH DIFFERENTIAL/PLATELET    EKG  EKG Interpretation None       Radiology No results found.  Procedures Procedures (including critical care time)  Medications Ordered in ED Medications  levETIRAcetam (KEPPRA) tablet 1,000 mg (1,000 mg Oral Given 08/17/16 0233)     Initial Impression / Assessment and Plan / ED Course  I have reviewed the triage vital signs and the nursing notes.  Pertinent labs & imaging results that were available during my care of the patient were reviewed by me and considered in my medical decision making (see chart  for details).  Clinical Course    Patient will be given by mouth Keppra load and observe  Patient has had no further seizure-like activity she is feeling good labs have been checked all within normal parameters mother is at bedside and with a patient home  Final Clinical Impressions(s) / ED Diagnoses   Final diagnoses:  Seizure (HCC)  Non compliance w medication regimen    New Prescriptions New Prescriptions   No medications on file     Earley FavorGail Kacy Hegna, NP 08/17/16 0222    Earley FavorGail Raea Magallon, NP 08/17/16 0522    Zadie Rhineonald Wickline, MD 08/17/16 [id number]

## 2016-08-17 NOTE — ED Notes (Signed)
Bed: WA05 Expected date:  Expected time:  Means of arrival:  Comments: seizure 

## 2016-08-17 NOTE — Discharge Instructions (Signed)
[facility] as instructed today he was given 1000 mg Keppra load in the emergency department please make an appointment follow-up with your primary care physician as needed

## 2017-07-28 ENCOUNTER — Ambulatory Visit (INDEPENDENT_AMBULATORY_CARE_PROVIDER_SITE_OTHER): Payer: BLUE CROSS/BLUE SHIELD | Admitting: Family Medicine

## 2017-07-28 ENCOUNTER — Encounter: Payer: Self-pay | Admitting: Family Medicine

## 2017-07-28 VITALS — BP 118/72 | HR 93 | Temp 98.2°F | Resp 12 | Ht 66.0 in | Wt 252.2 lb

## 2017-07-28 DIAGNOSIS — M549 Dorsalgia, unspecified: Secondary | ICD-10-CM | POA: Insufficient documentation

## 2017-07-28 DIAGNOSIS — Z6841 Body Mass Index (BMI) 40.0 and over, adult: Secondary | ICD-10-CM

## 2017-07-28 DIAGNOSIS — G4733 Obstructive sleep apnea (adult) (pediatric): Secondary | ICD-10-CM | POA: Diagnosis not present

## 2017-07-28 DIAGNOSIS — G8929 Other chronic pain: Secondary | ICD-10-CM

## 2017-07-28 DIAGNOSIS — R739 Hyperglycemia, unspecified: Secondary | ICD-10-CM

## 2017-07-28 DIAGNOSIS — R350 Frequency of micturition: Secondary | ICD-10-CM

## 2017-07-28 LAB — POCT GLYCOSYLATED HEMOGLOBIN (HGB A1C): Hemoglobin A1C: 5.5

## 2017-07-28 MED ORDER — METHOCARBAMOL 500 MG PO TABS: 500.0000 mg | ORAL_TABLET | Freq: Three times a day (TID) | ORAL | 1 refills | Status: DC | PRN

## 2017-07-28 NOTE — Progress Notes (Signed)
HPI:   [name] is a 31 y.o. female, who is here today to establish care.  Former PCP: N/A Last preventive routine visit: Gyn exam in 05/2014.  Chronic medical problems: Epilepsy, back pain, obesity, and OSA among some.  She follows with Dr Brooke Alexander for epilepsia.  She does not have driving restrictions but does not drive, does not have a license.  Concerns today:   Back pain: Intermittent for years but worse for the past few months.  No history of back injury.  Pain is not radiated, ache like, 8 or 9/10 in intensity, with no associated LE numbness, tingling, urinary incontinence or retention, stool incontinence, or saddle anesthesia.  Exacerbated by movement, prolonged standing or prolonged walking. Alleviated by changing position, rest, or OTC ibuprofen. No rash or edema on area, fever, chills, or abnormal wt loss.  Lumbar X ray 11/26/08: 1.  No acute findings about the lumbar spine. 2.  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  Urinary frequency and nocturia x 2-3 times for a couple years. Denies dysuria, gross hematuria,or decreased urine output.  Reporting sleep study done in 10/2014 Dx with OSA but did not get the CPAP machine.  + Fatigue.  She does not exercise regularly and does not follow a healthy diet.  Due to work schedule she eats fast food daily. Father has history of diabetes.  08/2016 glucose 122 (130).   Review of Systems  Constitutional: Positive for fatigue. Negative for activity change, appetite change and fever.  HENT: Negative for mouth sores, nosebleeds, sore throat and trouble swallowing.   Eyes: Negative for redness and visual disturbance.  Respiratory: Positive for apnea. Negative for cough, shortness of breath and wheezing.   Cardiovascular: Negative for chest pain, palpitations and leg swelling.  Gastrointestinal: Negative for abdominal pain, nausea and vomiting.       Negative for changes  in bowel habits.  Genitourinary: Positive for frequency. Negative for decreased urine volume, dysuria, hematuria and pelvic pain.  Musculoskeletal: Positive for back pain. Negative for arthralgias and gait problem.  Skin: Negative for rash and wound.  Neurological: Negative for seizures, syncope, weakness, numbness and headaches.  Hematological: Negative for adenopathy. Does not bruise/bleed easily.  Psychiatric/Behavioral: Positive for sleep disturbance. Negative for confusion.      Current Outpatient Medications on File Prior to Visit  Medication Sig Dispense Refill  . levETIRAcetam (KEPPRA) 500 MG tablet Take 1,000 mg by mouth every evening.     . topiramate (TOPAMAX) 100 MG tablet Take 100 mg by mouth every evening.      No current facility-administered medications on file prior to visit.      Past Medical History:  Diagnosis Date  . Seizures (HCC)    No Known Allergies  Family History  Problem Relation Age of Onset  . Diabetes Mother   . Hypertension Mother   . Diabetes Father     Social History   Socioeconomic History  . Marital status: Single    Spouse name: None  . Number of children: None  . Years of education: None  . Highest education level: None  Social Needs  . Financial resource strain: None  . Food insecurity - worry: None  . Food insecurity - inability: None  . Transportation needs - medical: None  . Transportation needs - non-medical: None  Occupational History  . None  Tobacco Use  . Smoking status: Never Smoker  . Smokeless tobacco: Never Used  Substance and Sexual Activity  . Alcohol use: No  . Drug use: No  . Sexual activity: Not Currently  Other Topics Concern  . None  Social History Narrative  . None    Vitals:   07/28/17 1459  BP: 118/72  Pulse: 93  Resp: 12  Temp: 98.2 F (36.8 C)  SpO2: 98%    Body mass index is 40.71 kg/m.   Physical Exam  Nursing note and vitals reviewed. Constitutional: She is oriented to  person, place, and time. She appears well-developed. No distress.  HENT:  Head: Normocephalic and atraumatic.  Mouth/Throat: Oropharynx is clear and moist and mucous membranes are normal.  Eyes: Conjunctivae are normal. Pupils are equal, round, and reactive to light.  Neck: No tracheal deviation present. No thyroid mass and no thyromegaly present.  Cardiovascular: Normal rate and regular rhythm.  No murmur heard. Pulses:      Dorsalis pedis pulses are 2+ on the right side, and 2+ on the left side.  Respiratory: Effort normal and breath sounds normal. No respiratory distress.  GI: Soft. She exhibits no mass. There is no hepatomegaly. There is no tenderness.  Musculoskeletal: She exhibits no edema.       Thoracic back: She exhibits tenderness and spasm. She exhibits no bony tenderness.       Lumbar back: She exhibits spasm. She exhibits no tenderness and no bony tenderness.  Lymphadenopathy:    She has no cervical adenopathy.  Neurological: She is alert and oriented to person, place, and time. She has normal strength. Gait normal.  SLR negative bilateral.  Skin: Skin is warm. No erythema.  Psychiatric: She has a normal mood and affect.  Well groomed, good eye contact.     ASSESSMENT AND PLAN:   [name] for establish care.  Diagnoses and all orders for this visit:  Hyperglycemia  A1C in normal range. Primary prevention of DM through a healthy life style recommended.  -     POCT glycosylated hemoglobin (Hb A1C)  Chronic bilateral back pain, unspecified back location  Wt loss may help. Stretching exercises recommended, handout given. Side effects of muscle relaxants discussed.  -     methocarbamol (ROBAXIN) 500 MG tablet; Take 1 tablet (500 mg total) by mouth every 8 (eight) hours as needed for muscle spasms.  Morbid obesity with BMI of 40.0-44.9, adult (HCC)  We discussed benefits of wt loss as well as adverse effects of obesity. Consistency with  healthy diet and physical activity recommended. Daily brisk walking for 15-30 min as tolerated.  OSA (obstructive sleep apnea)  We dicussed adverse effects of OSA. Wt loss strongly recommended. Pulmonology referral placed.  -     Ambulatory referral to Pulmonology  Urinary frequency  ? Overactive bladder. Limit fluid intake within 3 hours before bedtime as well as daily caffeine intake. Further recommendations will be given according to U/A results.  -     Urinalysis, Routine w reflex microscopic      Brooke Pauley G. SwazilandJordan, MD  [facility]. [place] office.

## 2017-07-28 NOTE — Patient Instructions (Addendum)
A few things to remember from today's visit:   Hyperglycemia - Plan: POCT glycosylated hemoglobin (Hb A1C)  Chronic bilateral back pain, unspecified back location - Plan: methocarbamol (ROBAXIN) 500 MG tablet  Morbid obesity with BMI of 40.0-44.9, adult (HCC)  OSA (obstructive sleep apnea) - Plan: Ambulatory referral to Pulmonology  Urinary frequency - Plan: Urinalysis, Routine w reflex microscopic    Back pain is very common in adults.The cause of back pain is rarely dangerous and the pain often gets better over time even with no pharmacologic treatment.  The cause of your back pain may not be known. Some common causes of back pain include: 1. Strain of the muscles or ligaments supporting the spine. 2. Wear and tear (degeneration) of the spinal disks. 3. Arthritis. 4. Direct injury to the back.  For many people, back pain may return. Since back pain is rarely dangerous, most people can learn to manage this condition on their own.  HOME CARE INSTRUCTIONS Watch your back pain for any changes. The following actions may help to lessen any discomfort you are feeling:  1. Remain active. It is stressful on your back to sit or stand in one place for long periods of time. Do not sit, drive, or stand in one place for more than 30 minutes at a time. Take short walks on even surfaces as soon as you are able.Try to increase the length of time you walk each day.  2. Exercise regularly as directed by your health care provider. Exercise helps your back heal faster. It also helps avoid future injury by keeping your muscles strong and flexible.  3. Do not stay in bed.Resting more than 1-2 days can delay your recovery.                                                      4. Pay attention to your body when you bend and lift. The most comfortable positions are those that put less stress on your recovering back.  5.  Always use proper lifting techniques, including: Bending your knees. Keeping  the load close to your body. Avoiding twisting.  6. Find a comfortable position to sleep. Use a firm mattress and lie on your side with your knees slightly bent. If you lie on your back, put a pillow under your knees.  7. Over the counter rubbing medications like Icy Hot or Asper cream with Lidocaine may help without significant side effects.  Acetaminophen and/or Aleve/Ibuprofen can be taken if needed and if not contraindications. Local ice and heat may be alternated to reduce pain and spasms. Also massage and even chiropractor treatment.      Muscle relaxants might or might not help, they cause drowsiness among other    side effects. They could also interact with some of medications you may be already taking (medications for depression/anxiety and some pain medications).   8. Maintain a healthy weight. Excess weight puts extra stress on your back and makes it difficult to maintain good posture.   SEEK MEDICAL CARE IF: worsening pain, associated fever, rash/edema on area, pain going to legs or buttocks, numbness/tingling, night pain, or abnormal weight loss.    SEEK IMMEDIATE MEDICAL CARE IF:  1. You develop new bowel or bladder control problems. 2. You have unusual weakness or numbness in your arms or legs. 3.  You develop nausea or vomiting. 4. You develop abdominal pain. 5. You feel faint.     Back Exercises The following exercises strengthen the muscles that help to support the back. They also help to keep the lower back flexible. Doing these exercises can help to prevent back pain or lessen existing pain. If you have back pain or discomfort, try doing these exercises 2-3 times each day or as told by your health care provider. When the pain goes away, do them once each day, but increase the number of times that you repeat the steps for each exercise (do more repetitions). If you do not have back pain or discomfort, do these exercises once each day or as told by your health care  provider.   EXERCISES Single Knee to Chest Repeat these steps 3-5 times for each leg: 5. Lie on your back on a firm bed or the floor with your legs extended. 6. Bring one knee to your chest. Your other leg should stay extended and in contact with the floor. 7. Hold your knee in place by grabbing your knee or thigh. 8. Pull on your knee until you feel a gentle stretch in your lower back. 9. Hold the stretch for 10-30 seconds. 10. Slowly release and straighten your leg.  Pelvic Tilt Repeat these steps 5-10 times: 2. Lie on your back on a firm bed or the floor with your legs extended. 3. Bend your knees so they are pointing toward the ceiling and your feet are flat on the floor. 4. Tighten your lower abdominal muscles to press your lower back against the floor. This motion will tilt your pelvis so your tailbone points up toward the ceiling instead of pointing to your feet or the floor. 5. With gentle tension and even breathing, hold this position for 5-10 seconds.  Cat-Cow Repeat these steps until your lower back becomes more flexible: 1. Get into a hands-and-knees position on a firm surface. Keep your hands under your shoulders, and keep your knees under your hips. You may place padding under your knees for comfort. 2. Let your head hang down, and point your tailbone toward the floor so your lower back becomes rounded like the back of a cat. 3. Hold this position for 5 seconds. 4. Slowly lift your head and point your tailbone up toward the ceiling so your back forms a sagging arch like the back of a cow. 5. Hold this position for 5 seconds.   Press-Ups Repeat these steps 5-10 times: 6. Lie on your abdomen (face-down) on the floor. 7. Place your palms near your head, about shoulder-width apart. 8. While you keep your back as relaxed as possible and keep your hips on the floor, slowly straighten your arms to raise the top half of your body and lift your shoulders. Do not use your back  muscles to raise your upper torso. You may adjust the placement of your hands to make yourself more comfortable. 9. Hold this position for 5 seconds while you keep your back relaxed. 10. Slowly return to lying flat on the floor.   Bridges Repeat these steps 10 times: 1. Lie on your back on a firm surface. 2. Bend your knees so they are pointing toward the ceiling and your feet are flat on the floor. 3. Tighten your buttocks muscles and lift your buttocks off of the floor until your waist is at almost the same height as your knees. You should feel the muscles working in your buttocks and the  back of your thighs. If you do not feel these muscles, slide your feet 1-2 inches farther away from your buttocks. 4. Hold this position for 3-5 seconds. 5. Slowly lower your hips to the starting position, and allow your buttocks muscles to relax completely. If this exercise is too easy, try doing it with your arms crossed over your chest.    Please be sure medication list is accurate. If a new problem present, please set up appointment sooner than planned today.

## 2018-01-18 ENCOUNTER — Ambulatory Visit (INDEPENDENT_AMBULATORY_CARE_PROVIDER_SITE_OTHER): Payer: BLUE CROSS/BLUE SHIELD | Admitting: Pulmonary Disease

## 2018-01-18 ENCOUNTER — Encounter: Payer: Self-pay | Admitting: Pulmonary Disease

## 2018-01-18 VITALS — BP 122/88 | HR 102 | Ht 66.0 in | Wt 254.4 lb

## 2018-01-18 DIAGNOSIS — G4733 Obstructive sleep apnea (adult) (pediatric): Secondary | ICD-10-CM | POA: Diagnosis not present

## 2018-01-18 NOTE — Assessment & Plan Note (Signed)
Weight loss of at least 10% of her body weight, about 25 pounds will be encouraged

## 2018-01-18 NOTE — Patient Instructions (Signed)
You have moderate sleep apnea Schedule home sleep study. Based on this we will set you up with a CPAP machine

## 2018-01-18 NOTE — Assessment & Plan Note (Signed)
She is at least moderate OSA. If anything she has gained weight since 2016 and her sleep apnea severity would be worse. We will repeat home sleep study and based on those get her started on CPAP machine  Weight loss encouraged, compliance with goal of at least 4-6 hrs every night is the expectation. Advised against medications with sedative side effects Cautioned against driving when sleepy - understanding that sleepiness will vary on a day to day basis

## 2018-01-18 NOTE — Progress Notes (Signed)
   Subjective:    Patient ID: Brooke Alexander, female    DOB: 1986-07-21, 32 y.o.   MRN: [id number]  HPI  Chief Complaint  Patient presents with  . sleep consult    daytime sleepiness referred by Dr. Swaziland   32 year old with seizure disorder presents for evaluation of obstructive sleep apnea. She works as Producer, television/film/video at [employer]. She reports being sleepy all the time and has an occasional morning headache.  Loud snoring is been noted by her mother.  She has epilepsy since childhood, last seizure was in 2018, she is maintained on Keppra and Topamax She was asked to take a sleep study by her neurologist at [facility] NPSG 09/2014 showed moderate OSA with AHI of 17/hour, total sleep time was 4 1 0 minutes, lowest desaturation of 71 minutes, she spent 8 minutes with saturation less than 88%, weight was 236 pounds. CPAP titration study 10/2014 showed CPAP requirement of 8 cm with a small full facemask.  She felt like she slept better night of titration.  Epworth sleepiness score is 7 and she reports sleepiness as a passenger in a car or lying down to rest in the afternoons.  Sometimes she will take a 3-hour nap after coming back from work. Bedtime is 11 PM, sleep latency is minimal, she sleeps on her side with 2 pillows, reports 3-4 nocturnal awakenings including nocturia and is out of bed at 6 AM feeling tired with occasional dryness of mouth but denies headaches. She has gained about 10 pounds in the last 2 years  There is no history suggestive of cataplexy, sleep paralysis or parasomnias  For some reason she never got a machine after her sleep study day 16.  Her PCP gave her a prescription of CPAP but DME would not fill this stating that she needs another sleep study   Past Medical History:  Diagnosis Date  . Seizures (HCC)     History reviewed. No pertinent surgical history.   Review of Systems Positive for shortness of breath at rest, weight gain, headaches, nasal  congestion, sneezing  Constitutional: negative for anorexia, fevers and sweats  Eyes: negative for irritation, redness and visual disturbance  Ears, nose, mouth, throat, and face: negative for earaches, epistaxis, nasal congestion and sore throat  Respiratory: negative for cough, dyspnea on exertion, sputum and wheezing  Cardiovascular: negative for chest pain,  lower extremity edema, orthopnea, palpitations and syncope  Gastrointestinal: negative for abdominal pain, constipation, diarrhea, melena, nausea and vomiting  Genitourinary:negative for dysuria, frequency and hematuria  Hematologic/lymphatic: negative for bleeding, easy bruising and lymphadenopathy  Musculoskeletal:negative for arthralgias, muscle weakness and stiff joints  Neurological: negative for coordination problems, gait problems, headaches and weakness  Endocrine: negative for diabetic symptoms including polydipsia, polyuria and weight loss     Objective:   Physical Exam  Gen. Pleasant, obese, in no distress ENT - no lesions, no post nasal drip Neck: No JVD, no thyromegaly, no carotid bruits Lungs: no use of accessory muscles, no dullness to percussion, decreased without rales or rhonchi  Cardiovascular: Rhythm regular, heart sounds  normal, no murmurs or gallops, no peripheral edema Musculoskeletal: No deformities, no cyanosis or clubbing , no tremors       Assessment & Plan:

## 2018-02-23 DIAGNOSIS — G4733 Obstructive sleep apnea (adult) (pediatric): Secondary | ICD-10-CM | POA: Diagnosis not present

## 2018-02-24 ENCOUNTER — Other Ambulatory Visit: Payer: Self-pay | Admitting: *Deleted

## 2018-02-24 DIAGNOSIS — G4733 Obstructive sleep apnea (adult) (pediatric): Secondary | ICD-10-CM

## 2018-02-26 ENCOUNTER — Telehealth: Payer: Self-pay | Admitting: Pulmonary Disease

## 2018-02-26 DIAGNOSIS — G4733 Obstructive sleep apnea (adult) (pediatric): Secondary | ICD-10-CM

## 2018-02-26 NOTE — Telephone Encounter (Signed)
Per RA, HST showed moderate OSA with 23 events per hour, worse when in the supine position and also worse compared to study done in 2016.   Recommends CPAP 10cm, small full face mask. OV with NP in 6 weeks. Will place order once patient is aware and agrees.

## 2018-03-02 NOTE — Telephone Encounter (Signed)
Spoke with patient. She is aware of results. She wishes to proceed with CPAP order. Advised patient that I would place the order today for her, she verbalized understanding. Will place a reminder in patient's chart to call her in a few weeks to see if she has received her machine so that I can get her scheduled for a follow up.

## 2018-04-14 ENCOUNTER — Telehealth: Payer: Self-pay | Admitting: Pulmonary Disease

## 2018-04-14 DIAGNOSIS — G4733 Obstructive sleep apnea (adult) (pediatric): Secondary | ICD-10-CM

## 2018-04-14 NOTE — Telephone Encounter (Signed)
Called pt who states her settings need to be turned down. Pt states when she tries to use her machine, she feels like she can hardly breathe and it also gives her a headache.  Pt states when she first used the machine, she was able to use it the first two nights but then after that, she has not been able to use it at all. Pt stated she was going to try to use it last night, 04/13/18 but the same problem happened and she was not able to use it.  A download has been printed and has been handed to Dr. Vassie LollAlva for him to look at. Dr. Vassie LollAlva, please advise on recommendations for pt. Thanks!

## 2018-04-14 NOTE — Telephone Encounter (Signed)
Called and spoke with pt regarding RA recommendations Placed DME order to [facility] to Change to auto settings 5 to 10 cm for cpap machine Pt verbalized understanding, and had no further concerns Nothing further needed.

## 2018-04-14 NOTE — Telephone Encounter (Signed)
Change to auto settings 5 to 10 cm

## 2018-04-15 ENCOUNTER — Telehealth: Payer: Self-pay | Admitting: Pulmonary Disease

## 2018-04-15 NOTE — Telephone Encounter (Signed)
Tried to call pt regarding RA recommendations, no VM just busy signal X2. X1

## 2018-04-15 NOTE — Telephone Encounter (Signed)
Spoke with the pt  She states they changed her pressure yesterday and she tried the CPAP again and still unable to use  She wore it for an hour or so and had to stop b/c it gave her chest tightness and anxiety which resolved off machine  She wants to go ahead and send it back and wants to know if Dr Vassie LollAlva has any further recs  Please advise thanks

## 2018-04-15 NOTE — Telephone Encounter (Signed)
She has moderate OSA Surprisingly, she was able to tolerate CPAP on the night of titration and initially at home. We have made all kinds of pressure changes. Last step would be to send her for a desensitization visit-please have her make an appointment with sleep center [phone].  If she is still not able to tolerate, did not discontinue CPAP and work on weight loss as the main intervention

## 2018-04-16 NOTE — Telephone Encounter (Signed)
Attempted to call patient today regarding RA recommendations. I did not receive an answer at time of call. I have left a voicemail message for pt to return call. X1  

## 2018-04-16 NOTE — Telephone Encounter (Signed)
Tried to call pt back, busy signal, tried twice. X1

## 2018-04-16 NOTE — Telephone Encounter (Signed)
Pt is returning call. Cb is [phone].

## 2018-04-19 NOTE — Telephone Encounter (Signed)
Left message for pt to call back today so we may discuss options to help with her CPAP machine.

## 2018-04-20 NOTE — Telephone Encounter (Signed)
Attempted to call patient today regarding CPAP machine. I did not receive an answer at time of call. I have left a voicemail message for pt to return call. X4  Per triage protocol,  This message will be closed and encounter signed today due to we have tried to call patient three time, no response. When patient returns call, a new message will be open to address patient's concerns at that time of call. Nothing further needed at this time of call.

## 2018-04-23 ENCOUNTER — Telehealth: Payer: Self-pay | Admitting: Pulmonary Disease

## 2018-04-23 NOTE — Telephone Encounter (Signed)
Called patient, unable to reach left message to give us a call back. 

## 2018-04-26 NOTE — Telephone Encounter (Signed)
She has moderate OSA Surprisingly, she was able to tolerate CPAP on the night of titration and initially at home. We have made all kinds of pressure changes. Last step would be to send her for a desensitization visit-please have her make an appointment with sleep center [phone].  If she is still not able to tolerate, did not discontinue CPAP and work on weight loss as the main intervention  Left message for patient to call back for recs.

## 2018-04-28 NOTE — Telephone Encounter (Signed)
LMTCB

## 2018-04-29 NOTE — Telephone Encounter (Signed)
Spoke with pt. She is aware of this information. States that she has already turned in her CPAP machine. Nothing further was needed at this time.

## 2018-07-15 NOTE — Progress Notes (Signed)
HPI:   Brooke Alexander is a 32 y.o. female, who is here today for her routine physical.  Last CPE: 2015  Regular exercise 3 or more time per week: No Following a healthy diet: Trying to eat healthier. She lives with her mother.  Chronic medical problems: Epilepsy, back pain, and OSA She follows with neurologist, last seizure about 4 years ago.   Pap smear A couple of years ago.She follows with gyn.  There is no immunization history on file for this patient.   -She has some concerns today.   Bilateral lower back pain, "extremely" painful. Exacerbated by palpation and certain movements. Alleviated by rest/position changes. Pain is not radiated. Negative for LE numbness, tingling,saddle anesthesia,or bowel/urine incontinence.  Constant sharp pain 6/10.  No Hx of trauma. This is a chronic problem but worse for the past 2 months.  Left "side" pain,new problem. Pain has been going on for 1-2 weeks. Achy ,5/10,intermittent.  Exacerbated by full bladder. No alleviating factors identified. + Urinary frequency for "a while."   Denies associated fever,chills, nausea, vomiting, changes in bowel habits, blood in stool or melena. Bowel movements "often",2-3 times per day, her baseline.  Denies dysuria,gross hematuria,or decreased urine output.  Negative for vaginal bleeding or abnormal discharge.  Problem is stable. LMP 06/02/18. Denies sex intercourse.   Review of Systems  Constitutional: Negative for appetite change, fatigue and fever.  HENT: Negative for hearing loss, mouth sores, sore throat, trouble swallowing and voice change.   Eyes: Negative for redness and visual disturbance.  Respiratory: Negative for cough, shortness of breath and wheezing.   Cardiovascular: Negative for chest pain, palpitations and leg swelling.  Gastrointestinal: Negative for abdominal pain, blood in stool, nausea and vomiting.       No changes in bowel habits.  Endocrine:  Negative for cold intolerance and heat intolerance.  Genitourinary: Positive for frequency and menstrual problem. Negative for decreased urine volume, dysuria, hematuria, vaginal bleeding and vaginal discharge.  Musculoskeletal: Positive for back pain. Negative for arthralgias, gait problem and joint swelling.  Skin: Negative for color change and rash.  Neurological: Negative for seizures, syncope, weakness, numbness and headaches.  Psychiatric/Behavioral: Negative for confusion and sleep disturbance. The patient is not nervous/anxious.   All other systems reviewed and are negative.     Current Outpatient Medications on File Prior to Visit  Medication Sig Dispense Refill  . levETIRAcetam (KEPPRA) 500 MG tablet Take 1,000 mg by mouth every evening.     . topiramate (TOPAMAX) 100 MG tablet Take 100 mg by mouth every evening.      No current facility-administered medications on file prior to visit.      Past Medical History:  Diagnosis Date  . Seizures (HCC)     History reviewed. No pertinent surgical history.  No Known Allergies  Family History  Problem Relation Age of Onset  . Diabetes Mother   . Hypertension Mother   . Diabetes Father     Social History   Socioeconomic History  . Marital status: Single    Spouse name: Not on file  . Number of children: Not on file  . Years of education: Not on file  . Highest education level: Not on file  Occupational History  . Not on file  Social Needs  . Financial resource strain: Not on file  . Food insecurity:    Worry: Not on file    Inability: Not on file  . Transportation needs:    Medical:  Not on file    Non-medical: Not on file  Tobacco Use  . Smoking status: Never Smoker  . Smokeless tobacco: Never Used  Substance and Sexual Activity  . Alcohol use: No  . Drug use: No  . Sexual activity: Not Currently  Lifestyle  . Physical activity:    Days per week: Not on file    Minutes per session: Not on file  .  Stress: Not on file  Relationships  . Social connections:    Talks on phone: Not on file    Gets together: Not on file    Attends religious service: Not on file    Active member of club or organization: Not on file    Attends meetings of clubs or organizations: Not on file    Relationship status: Not on file  Other Topics Concern  . Not on file  Social History Narrative  . Not on file     Vitals:   07/16/18 0700  BP: 122/78  Pulse: 96  Resp: 12  Temp: 98.7 F (37.1 C)  SpO2: 98%   Body mass index is 41.02 kg/m.   Wt Readings from Last 3 Encounters:  07/16/18 254 lb 2 oz (115.3 kg)  01/18/18 254 lb 6.4 oz (115.4 kg)  07/28/17 252 lb 4 oz (114.4 kg)      Physical Exam  Nursing note and vitals reviewed. Constitutional: She is oriented to person, place, and time. She appears well-developed. No distress.  HENT:  Head: Normocephalic and atraumatic.  Right Ear: Hearing, tympanic membrane, external ear and ear canal normal.  Left Ear: Hearing, tympanic membrane, external ear and ear canal normal.  Mouth/Throat: Uvula is midline, oropharynx is clear and moist and mucous membranes are normal.  Eyes: Pupils are equal, round, and reactive to light. Conjunctivae and EOM are normal.  Neck: No tracheal deviation present. No thyromegaly present.  Cardiovascular: Normal rate and regular rhythm.  No murmur heard. Pulses:      Dorsalis pedis pulses are 2+ on the right side, and 2+ on the left side.  Respiratory: Effort normal and breath sounds normal. No respiratory distress.  GI: Soft. She exhibits no mass. There is no hepatomegaly. There is tenderness (mild) in the suprapubic area. There is no rigidity and no guarding.    Genitourinary:  Genitourinary Comments: Deferred to gyn.  Musculoskeletal: She exhibits tenderness. She exhibits no edema.       Lumbar back: She exhibits tenderness. She exhibits no bony tenderness.       Back:  No major deformity or signs of synovitis  appreciated.  Lymphadenopathy:    She has no cervical adenopathy.       Right: No supraclavicular adenopathy present.       Left: No supraclavicular adenopathy present.  Neurological: She is alert and oriented to person, place, and time. She has normal strength. No cranial nerve deficit. Coordination and gait normal.  Reflex Scores:      Bicep reflexes are 2+ on the right side and 2+ on the left side.      Patellar reflexes are 2+ on the right side and 2+ on the left side. SLR negative bilateral.  Skin: Skin is warm. No rash noted. No erythema.  Psychiatric: She has a normal mood and affect. Her speech is normal.  Well groomed, good eye contact.      ASSESSMENT AND PLAN:  [name] was here today annual physical examination.  Orders Placed This Encounter  Procedures  .  DG Lumbar Spine Complete  . Comprehensive metabolic panel  . TSH  . Lipid panel  . CBC with Differential/Platelet  . VITAMIN D 25 Hydroxy (Vit-D Deficiency, Fractures)  . Urinalysis, Routine w reflex microscopic  . Ambulatory referral to Physical Therapy  . POCT urine pregnancy    Lab Results  Component Value Date   WBC 5.3 07/16/2018   HGB 14.5 07/16/2018   HCT 43.4 07/16/2018   MCV 88.4 07/16/2018   PLT 271.0 07/16/2018   Lab Results  Component Value Date   CHOL 181 07/16/2018   HDL 37.60 (L) 07/16/2018   LDLCALC 121 (H) 07/16/2018   TRIG 108.0 07/16/2018   CHOLHDL 5 07/16/2018   Lab Results  Component Value Date   ALT 12 07/16/2018   AST 13 07/16/2018   ALKPHOS 86 07/16/2018   BILITOT 0.3 07/16/2018   Lab Results  Component Value Date   CREATININE 0.87 07/16/2018   BUN 8 07/16/2018   NA 140 07/16/2018   K 4.3 07/16/2018   CL 107 07/16/2018   CO2 25 07/16/2018     Routine general medical examination at a health care facility We discussed the importance of regular physical activity and healthy diet for prevention of chronic illness and/or complications. Preventive  guidelines reviewed. Vaccination up to date. She will follow with her gyn for her female preventive care. Next CPE in a year.   Screening for lipid disorders -     Lipid panel  Chronic bilateral low back pain without sciatica Treatment options discussed. She agrees with trying PT. Side effects of NSAID's and muscle relaxants discussed.  -     DG Lumbar Spine Complete; Future -     naproxen (NAPROSYN) 500 MG tablet; Take 1 tablet (500 mg total) by mouth 2 (two) times daily with a meal. -     cyclobenzaprine (FLEXERIL) 10 MG tablet; Take 1 tablet (10 mg total) by mouth 2 (two) times daily as needed for muscle spasms. -     Ambulatory referral to Physical Therapy  Abdominal pain, lower Possible etiologies discussed. Instructed about warning signs. Recommend addressing problem with gyn,may need pelvic US. F/U in 2 months.  -     Comprehensive metabolic panel -     CBC with Differential/Platelet  Urinary frequency  Possible etiologies discussed. She agrees with trying Toviaz. Further recommendations will be given according to lab results. F?u in 2 months.  -     Comprehensive metabolic panel -     fesoterodine (TOVIAZ) 4 MG TB24 tablet; Take 1 tablet (4 mg total) by mouth daily. -     Urinalysis, Routine w reflex microscopic  Missed menses -     TSH -     POCT urine pregnancy  Low calcium levels -     VITAMIN D 25 Hydroxy (Vit-D Deficiency, Fractures)      Return in 2 months (on 09/15/2018) for urinary fre,back pain and abdomi pain.      Betty G. [place], MD  [facility]. [place] office.

## 2018-07-16 ENCOUNTER — Encounter: Payer: Self-pay | Admitting: Family Medicine

## 2018-07-16 ENCOUNTER — Ambulatory Visit (INDEPENDENT_AMBULATORY_CARE_PROVIDER_SITE_OTHER): Payer: BLUE CROSS/BLUE SHIELD | Admitting: Family Medicine

## 2018-07-16 VITALS — BP 122/78 | HR 96 | Temp 98.7°F | Resp 12 | Ht 66.0 in | Wt 254.1 lb

## 2018-07-16 DIAGNOSIS — R350 Frequency of micturition: Secondary | ICD-10-CM

## 2018-07-16 DIAGNOSIS — M545 Low back pain: Secondary | ICD-10-CM | POA: Diagnosis not present

## 2018-07-16 DIAGNOSIS — G8929 Other chronic pain: Secondary | ICD-10-CM

## 2018-07-16 DIAGNOSIS — Z13220 Encounter for screening for lipoid disorders: Secondary | ICD-10-CM | POA: Diagnosis not present

## 2018-07-16 DIAGNOSIS — N926 Irregular menstruation, unspecified: Secondary | ICD-10-CM

## 2018-07-16 DIAGNOSIS — Z0000 Encounter for general adult medical examination without abnormal findings: Secondary | ICD-10-CM

## 2018-07-16 DIAGNOSIS — R1030 Lower abdominal pain, unspecified: Secondary | ICD-10-CM | POA: Diagnosis not present

## 2018-07-16 LAB — CBC WITH DIFFERENTIAL/PLATELET
BASOS ABS: 0.1 10*3/uL (ref 0.0–0.1)
Basophils Relative: 2.2 % (ref 0.0–3.0)
EOS PCT: 7.7 % — AB (ref 0.0–5.0)
Eosinophils Absolute: 0.4 10*3/uL (ref 0.0–0.7)
HCT: 43.4 % (ref 36.0–46.0)
HEMOGLOBIN: 14.5 g/dL (ref 12.0–15.0)
Lymphocytes Relative: 19.5 % (ref 12.0–46.0)
Lymphs Abs: 1 10*3/uL (ref 0.7–4.0)
MCHC: 33.5 g/dL (ref 30.0–36.0)
MCV: 88.4 fl (ref 78.0–100.0)
MONO ABS: 0.6 10*3/uL (ref 0.1–1.0)
Monocytes Relative: 12.1 % — ABNORMAL HIGH (ref 3.0–12.0)
NEUTROS PCT: 58.5 % (ref 43.0–77.0)
Neutro Abs: 3.1 10*3/uL (ref 1.4–7.7)
Platelets: 271 10*3/uL (ref 150.0–400.0)
RBC: 4.91 Mil/uL (ref 3.87–5.11)
RDW: 13.3 % (ref 11.5–15.5)
WBC: 5.3 10*3/uL (ref 4.0–10.5)

## 2018-07-16 LAB — COMPREHENSIVE METABOLIC PANEL
ALBUMIN: 4.3 g/dL (ref 3.5–5.2)
ALK PHOS: 86 U/L (ref 39–117)
ALT: 12 U/L (ref 0–35)
AST: 13 U/L (ref 0–37)
BUN: 8 mg/dL (ref 6–23)
CHLORIDE: 107 meq/L (ref 96–112)
CO2: 25 mEq/L (ref 19–32)
CREATININE: 0.87 mg/dL (ref 0.40–1.20)
Calcium: 9 mg/dL (ref 8.4–10.5)
GFR: 97.56 mL/min (ref >60.00)
Glucose, Bld: 107 mg/dL — ABNORMAL HIGH (ref 70–99)
Potassium: 4.3 mEq/L (ref 3.5–5.1)
Sodium: 140 mEq/L (ref 135–145)
Total Bilirubin: 0.3 mg/dL (ref 0.2–1.2)
Total Protein: 7 g/dL (ref 6.0–8.3)

## 2018-07-16 LAB — URINALYSIS, ROUTINE W REFLEX MICROSCOPIC
Bilirubin Urine: NEGATIVE
Hgb urine dipstick: NEGATIVE
Ketones, ur: NEGATIVE
Leukocytes, UA: NEGATIVE
Nitrite: NEGATIVE
PH: 6 (ref 5.0–8.0)
SPECIFIC GRAVITY, URINE: 1.025 (ref 1.000–1.030)
TOTAL PROTEIN, URINE-UPE24: NEGATIVE
URINE GLUCOSE: NEGATIVE
UROBILINOGEN UA: 0.2 (ref 0.0–1.0)

## 2018-07-16 LAB — LIPID PANEL
Cholesterol: 181 mg/dL (ref 0–200)
HDL: 37.6 mg/dL — ABNORMAL LOW (ref >39.00)
LDL Cholesterol: 121 mg/dL — ABNORMAL HIGH (ref 0–99)
NONHDL: 142.91
Total CHOL/HDL Ratio: 5
Triglycerides: 108 mg/dL (ref 0.0–149.0)
VLDL: 21.6 mg/dL (ref 0.0–40.0)

## 2018-07-16 LAB — POCT URINE PREGNANCY: Preg Test, Ur: NEGATIVE

## 2018-07-16 LAB — VITAMIN D 25 HYDROXY (VIT D DEFICIENCY, FRACTURES): VITD: 9.91 ng/mL — AB (ref 30.00–100.00)

## 2018-07-16 LAB — TSH: TSH: 1.35 u[IU]/mL (ref 0.35–4.50)

## 2018-07-16 MED ORDER — FESOTERODINE FUMARATE ER 4 MG PO TB24: 4.0000 mg | ORAL_TABLET | Freq: Every day | ORAL | 1 refills | Status: DC

## 2018-07-16 MED ORDER — NAPROXEN 500 MG PO TABS: 500.0000 mg | ORAL_TABLET | Freq: Two times a day (BID) | ORAL | 0 refills | Status: DC

## 2018-07-16 MED ORDER — CYCLOBENZAPRINE HCL 10 MG PO TABS: 10.0000 mg | ORAL_TABLET | Freq: Two times a day (BID) | ORAL | 0 refills | Status: DC | PRN

## 2018-07-16 NOTE — Patient Instructions (Addendum)
A few things to remember from today's visit:   Routine general medical examination at a health care facility  Screening for lipid disorders - Plan: Lipid panel  Chronic bilateral low back pain without sciatica - Plan: DG Lumbar Spine Complete, naproxen (NAPROSYN) 500 MG tablet, cyclobenzaprine (FLEXERIL) 10 MG tablet, Ambulatory referral to Physical Therapy  Abdominal pain, lower - Plan: Comprehensive metabolic panel, CBC with Differential/Platelet  Urinary frequency - Plan: Comprehensive metabolic panel, fesoterodine (TOVIAZ) 4 MG TB24 tablet, Urinalysis, Routine w reflex microscopic  Missed menses - Plan: TSH, POCT urine pregnancy  Low calcium levels - Plan: VITAMIN D 25 Hydroxy (Vit-D Deficiency, Fractures)  Today you have you routine preventive visit.  At least 150 minutes of moderate exercise per week, daily brisk walking for 15-30 min is a good exercise option. Healthy diet low in saturated (animal) fats and sweets and consisting of fresh fruits and vegetables, lean meats such as fish and white chicken and whole grains.  These are some of recommendations for screening depending of age and risk factors:   - Vaccines:  Tdap vaccine every 10 years.  Shingles vaccine recommended at age 31, could be given after 31 years of age but not sure about insurance coverage.   Pneumonia vaccines:  Prevnar 13 at 65 and Pneumovax at 66. Sometimes Pneumovax is giving earlier if history of smoking, lung disease,diabetes,kidney disease among some.    Screening for diabetes at age 31 and every 3 years.  Cervical cancer prevention:  Pap smear starts at 31 years of age and continues periodically until 10873 years old in low risk women. Pap smear every 3 years between 5621 and 10423 years old. Pap smear every 3-5 years between women 30 and older if pap smear negative and HPV screening negative. You need to arrange appointment with gynecologist.   -Breast cancer: Mammogram: There is disagreement  between experts about when to start screening in low risk asymptomatic female but recent recommendations are to start screening at 1440 and not later than 31 years old , every 1-2 years and after 31 yo q 2 years. Screening is recommended until 31 years old but some women can continue screening depending of healthy issues.   Colon cancer screening: starts at 31 years old until 31 years old.  Cholesterol disorder screening at age 31 and every 3 years.  Also recommended:  1. Dental visit- Brush and floss your teeth twice daily; visit your dentist twice a year. 2. Eye doctor- Get an eye exam at least every 2 years. 3. Helmet use- Always wear a helmet when riding a bicycle, motorcycle, rollerblading or skateboarding. 4. Safe sex- If you may be exposed to sexually transmitted infections, use a condom. 5. Seat belts- Seat belts can save your live; always wear one. 6. Smoke/Carbon Monoxide detectors- These detectors need to be installed on the appropriate level of your home. Replace batteries at least once a year. 7. Skin cancer- When out in the sun please cover up and use sunscreen 15 SPF or higher. 8. Violence- If anyone is threatening or hurting you, please tell your healthcare provider.  9. Drink alcohol in moderation- Limit alcohol intake to one drink or less per day. Never drink and drive.  Please be sure medication list is accurate. If a new problem present, please set up appointment sooner than planned today.

## 2018-07-20 ENCOUNTER — Other Ambulatory Visit: Payer: Self-pay | Admitting: *Deleted

## 2018-07-20 MED ORDER — VITAMIN D (ERGOCALCIFEROL) 1.25 MG (50000 UNIT) PO CAPS: 50000.0000 [IU] | ORAL_CAPSULE | ORAL | 2 refills | Status: AC

## 2018-07-27 ENCOUNTER — Ambulatory Visit: Payer: Self-pay | Admitting: *Deleted

## 2018-07-27 NOTE — Telephone Encounter (Signed)
Patient called and she has questions concerning how to take the Vitamin D, Ergocalciferol, (DRISDOL) 1.25 MG (50000 UT) CAPS capsule ,she says there are no instructions. please call her at [phone]  Returned call to patient for clarification of how to take her Vitamin D, Drisdol [id number] U.  Per lab result:  Ergocalciferol 50,000 U weekly x 8 weeks then q 2 weeks.  Pt voiced understanding. Please notify patient for any changes in this prescription thru MyChart.  Reason for Disposition . [1] Caller requesting NON-URGENT health information AND [2] PCP's office is the best resource  Answer Assessment - Initial Assessment Questions 1. REASON FOR CALL or QUESTION: "What is your reason for calling today?" or "How can I best help you?" or "What question do you have that I can help answer?"     Need instructions on how to take Ergocalciferol 50000 units.  Protocols used: INFORMATION ONLY CALL-A-AH

## 2018-07-27 NOTE — Telephone Encounter (Signed)
Attempted to contact pt to review medication instructions; left message on voicemail [phone].

## 2018-07-27 NOTE — Telephone Encounter (Signed)
Returned call to patient, patient answered and then hung up. Called back and no answer, unable to leave message, mailbox is full.

## 2018-07-28 NOTE — Telephone Encounter (Signed)
Attempted to contact patient, unable to leave message, mailbox full. 

## 2018-08-03 NOTE — Telephone Encounter (Signed)
Instructions left on voicemail of how to take medication.  No further assistance needed at this time.

## 2018-10-11 ENCOUNTER — Encounter: Payer: Self-pay | Admitting: Family Medicine

## 2018-10-11 ENCOUNTER — Ambulatory Visit: Payer: BLUE CROSS/BLUE SHIELD | Admitting: Family Medicine

## 2018-10-11 VITALS — BP 124/83 | HR 83 | Temp 97.5°F | Resp 12 | Ht 66.0 in | Wt 245.2 lb

## 2018-10-11 DIAGNOSIS — R071 Chest pain on breathing: Secondary | ICD-10-CM

## 2018-10-11 DIAGNOSIS — R209 Unspecified disturbances of skin sensation: Secondary | ICD-10-CM

## 2018-10-11 DIAGNOSIS — R0789 Other chest pain: Secondary | ICD-10-CM

## 2018-10-11 DIAGNOSIS — N644 Mastodynia: Secondary | ICD-10-CM | POA: Diagnosis not present

## 2018-10-11 LAB — BASIC METABOLIC PANEL
BUN: 10 mg/dL (ref 6–23)
CALCIUM: 9.5 mg/dL (ref 8.4–10.5)
CHLORIDE: 106 meq/L (ref 96–112)
CO2: 24 meq/L (ref 19–32)
Creatinine, Ser: 0.72 mg/dL (ref 0.40–1.20)
GFR: 114.01 mL/min (ref >60.00)
GLUCOSE: 86 mg/dL (ref 70–99)
Potassium: 4.3 mEq/L (ref 3.5–5.1)
SODIUM: 139 meq/L (ref 135–145)

## 2018-10-11 LAB — VITAMIN B12: Vitamin B-12: 585 pg/mL (ref 211–911)

## 2018-10-11 NOTE — Progress Notes (Signed)
ACUTE VISIT   HPI:  Chief Complaint  Patient presents with  . Numbness    left side of chest x 1 week off and on    [name] is a 33 y.o. female, who is here today complaining of intermittent numbness and a couple times shooting left upper chest,above breast. Pain lasted a few seconds. She has not identified exacerbating or alleviating factors. Negative for rash or local edema. No cough,wheezing,or dyspnea.  Pain happens at rest. Hx of back pain,unchanged. She was on Flexeril.  Hx of seizure disorder,she is on Topamax and Keppra.   Lab Results  Component Value Date   CREATININE 0.87 07/16/2018   BUN 8 07/16/2018   NA 140 07/16/2018   K 4.3 07/16/2018   CL 107 07/16/2018   CO2 25 07/16/2018   Lab Results  Component Value Date   TSH 1.35 07/16/2018   Lab Results  Component Value Date   HGBA1C 5.5 07/28/2017    Review of Systems  Constitutional: Negative for activity change, appetite change, chills and fever.  HENT: Negative for sore throat and trouble swallowing.   Eyes: Negative for redness and visual disturbance.  Respiratory: Negative for cough, shortness of breath and wheezing.   Cardiovascular: Negative for chest pain, palpitations and leg swelling.  Gastrointestinal: Negative for abdominal pain, nausea and vomiting.       Negative for changes in bowel habits.  Musculoskeletal: Positive for back pain. Negative for gait problem and neck pain.  Skin: Negative for color change and rash.  Neurological: Positive for numbness. Negative for seizures, syncope, weakness and headaches.  Psychiatric/Behavioral: Negative for sleep disturbance. The patient is nervous/anxious.       Current Outpatient Medications on File Prior to Visit  Medication Sig Dispense Refill  . Cholecalciferol (VITAMIN D3) 1.25 MG (50000 UT) CAPS     . levETIRAcetam (KEPPRA) 500 MG tablet Take 1,000 mg by mouth every evening.     . topiramate (TOPAMAX) 50 MG tablet       No current facility-administered medications on file prior to visit.      Past Medical History:  Diagnosis Date  . Seizures (HCC)    No Known Allergies  Social History   Socioeconomic History  . Marital status: Single    Spouse name: Not on file  . Number of children: Not on file  . Years of education: Not on file  . Highest education level: Not on file  Occupational History  . Not on file  Social Needs  . Financial resource strain: Not on file  . Food insecurity:    Worry: Not on file    Inability: Not on file  . Transportation needs:    Medical: Not on file    Non-medical: Not on file  Tobacco Use  . Smoking status: Never Smoker  . Smokeless tobacco: Never Used  Substance and Sexual Activity  . Alcohol use: No  . Drug use: No  . Sexual activity: Not Currently  Lifestyle  . Physical activity:    Days per week: Not on file    Minutes per session: Not on file  . Stress: Not on file  Relationships  . Social connections:    Talks on phone: Not on file    Gets together: Not on file    Attends religious service: Not on file    Active member of club or organization: Not on file    Attends meetings of clubs or organizations: Not on  file    Relationship status: Not on file  Other Topics Concern  . Not on file  Social History Narrative  . Not on file    Vitals:   10/11/18 1452  BP: 124/83  Pulse: 83  Resp: 12  Temp: (!) 97.5 F (36.4 C)  SpO2: 96%   Body mass index is 39.58 kg/m.   Physical Exam  Nursing note and vitals reviewed. Constitutional: She is oriented to person, place, and time. She appears well-developed. No distress.  HENT:  Head: Normocephalic and atraumatic.  Mouth/Throat: Oropharynx is clear and moist and mucous membranes are normal.  Eyes: Conjunctivae are normal.  Cardiovascular: Normal rate and regular rhythm.  No murmur heard. Respiratory: Effort normal and breath sounds normal. No respiratory distress. She exhibits tenderness.   GI: Soft. She exhibits no mass. There is no abdominal tenderness.  Genitourinary:    Genitourinary Comments: Breast:Mild tenderness upon palpation of inner upper quadrant left breast.   Fibrocystic-like changes, negative for skin color changes, breast masses, or nipple discharge bilaterally.   Musculoskeletal:        General: No edema.     Left shoulder: She exhibits no tenderness.     Cervical back: She exhibits no tenderness and no bony tenderness.     Comments: Tenderness upon palpation of left upper chest wall, right above left breast. Also pain on costochondral joints.   Lymphadenopathy:    She has no cervical adenopathy.    She has no axillary adenopathy.  Neurological: She is alert and oriented to person, place, and time. She has normal strength. No cranial nerve deficit. Gait normal.  Skin: Skin is warm. No rash noted. No erythema.  Psychiatric: Her mood appears anxious.  Well groomed, good eye contact.      ASSESSMENT AND PLAN:  [name] was seen today for numbness.  Diagnoses and all orders for this visit:  Lab Results  Component Value Date   VITAMINB12 585 10/11/2018   Lab Results  Component Value Date   CREATININE 0.72 10/11/2018   BUN 10 10/11/2018   NA 139 10/11/2018   K 4.3 10/11/2018   CL 106 10/11/2018   CO2 24 10/11/2018    Skin sensation disturbance We discussed possible etiologies, including medications and radicular pain. Neurologic examination is normal. Recommend continue monitoring for new symptoms.  -     Basic metabolic panel -     Vitamin B12  Breast pain in female Pain upon palpation right above breast. Concerned about breast related pain. Explained that the probability of breast cancer at her age is low but never 0. Dx mammogram will be arranged.  -     MS DIGITAL DIAG TOMO BILAT; Future  Costochondral chest pain Chest pain seems more musculoskeletal. Reassured about cardiac etiology. Educated about Dx. She can take OTC  analgesics. Instructed about warning signs.   Return if symptoms worsen or fail to improve.    Dondi Aime G. [place], MD  [facility]. [place] office.

## 2018-10-11 NOTE — Patient Instructions (Signed)
A few things to remember from today's visit:   Breast pain in female - Plan: MS DIGITAL DIAG TOMO BILAT  Skin sensation disturbance - Plan: Basic metabolic panel, Vitamin B12  Costochondral chest pain  Breast pain could be related to wall chest pain.  Monitor for worsening symptoms.  Please be sure medication list is accurate. If a new problem present, please set up appointment sooner than planned today.

## 2018-10-14 ENCOUNTER — Encounter: Payer: Self-pay | Admitting: Family Medicine

## 2018-10-15 ENCOUNTER — Other Ambulatory Visit: Payer: Self-pay | Admitting: Family Medicine

## 2018-10-15 DIAGNOSIS — N644 Mastodynia: Secondary | ICD-10-CM

## 2018-10-21 ENCOUNTER — Ambulatory Visit: Payer: BLUE CROSS/BLUE SHIELD

## 2018-10-21 ENCOUNTER — Ambulatory Visit
Admission: RE | Admit: 2018-10-21 | Discharge: 2018-10-21 | Disposition: A | Discharge status: Home / Self Care | Payer: BLUE CROSS/BLUE SHIELD | Source: Ambulatory Visit | Attending: Family Medicine | Admitting: Family Medicine

## 2018-10-21 DIAGNOSIS — N644 Mastodynia: Secondary | ICD-10-CM

## 2019-08-31 ENCOUNTER — Other Ambulatory Visit: Payer: Self-pay

## 2019-08-31 DIAGNOSIS — Z20822 Contact with and (suspected) exposure to covid-19: Secondary | ICD-10-CM

## 2019-09-01 LAB — NOVEL CORONAVIRUS, NAA: SARS-CoV-2, NAA: NOT DETECTED

## 2019-12-29 ENCOUNTER — Other Ambulatory Visit: Payer: Self-pay

## 2019-12-30 ENCOUNTER — Encounter: Payer: Self-pay | Admitting: Family Medicine

## 2019-12-30 ENCOUNTER — Ambulatory Visit (INDEPENDENT_AMBULATORY_CARE_PROVIDER_SITE_OTHER): Payer: BC Managed Care – PPO | Admitting: Family Medicine

## 2019-12-30 VITALS — BP 118/66 | HR 79 | Temp 97.7°F | Resp 12 | Ht 66.0 in | Wt 255.5 lb

## 2019-12-30 DIAGNOSIS — Z1329 Encounter for screening for other suspected endocrine disorder: Secondary | ICD-10-CM

## 2019-12-30 DIAGNOSIS — Z13228 Encounter for screening for other metabolic disorders: Secondary | ICD-10-CM

## 2019-12-30 DIAGNOSIS — R569 Unspecified convulsions: Secondary | ICD-10-CM

## 2019-12-30 DIAGNOSIS — Z0000 Encounter for general adult medical examination without abnormal findings: Secondary | ICD-10-CM

## 2019-12-30 DIAGNOSIS — Z130 Encounter for screening for diseases of the blood and blood-forming organs and certain disorders involving the immune mechanism: Secondary | ICD-10-CM | POA: Diagnosis not present

## 2019-12-30 DIAGNOSIS — Z13220 Encounter for screening for lipoid disorders: Secondary | ICD-10-CM | POA: Diagnosis not present

## 2019-12-30 DIAGNOSIS — N62 Hypertrophy of breast: Secondary | ICD-10-CM

## 2019-12-30 DIAGNOSIS — G8929 Other chronic pain: Secondary | ICD-10-CM

## 2019-12-30 DIAGNOSIS — M549 Dorsalgia, unspecified: Secondary | ICD-10-CM | POA: Diagnosis not present

## 2019-12-30 LAB — HEMOGLOBIN A1C: Hgb A1c MFr Bld: 5.6 % (ref 4.6–6.5)

## 2019-12-30 LAB — LIPID PANEL
Cholesterol: 178 mg/dL (ref 0–200)
HDL: 36.9 mg/dL — ABNORMAL LOW (ref >39.00)
LDL Cholesterol: 129 mg/dL — ABNORMAL HIGH (ref 0–99)
NonHDL: 141.25
Total CHOL/HDL Ratio: 5
Triglycerides: 62 mg/dL (ref 0.0–149.0)
VLDL: 12.4 mg/dL (ref 0.0–40.0)

## 2019-12-30 LAB — BASIC METABOLIC PANEL
BUN: 14 mg/dL (ref 6–23)
CO2: 26 mEq/L (ref 19–32)
Calcium: 8.6 mg/dL (ref 8.4–10.5)
Chloride: 107 mEq/L (ref 96–112)
Creatinine, Ser: 0.78 mg/dL (ref 0.40–1.20)
GFR: 103.16 mL/min (ref >60.00)
Glucose, Bld: 85 mg/dL (ref 70–99)
Potassium: 4.3 mEq/L (ref 3.5–5.1)
Sodium: 139 mEq/L (ref 135–145)

## 2019-12-30 NOTE — Patient Instructions (Signed)
Today you have you routine preventive visit. A few things to remember from today's visit:   Routine general medical examination at a health care facility  Screening for lipoid disorders - Plan: Lipid panel  Screening for endocrine, metabolic and immunity disorder - Plan: Basic metabolic panel, Hemoglobin A1c  Chronic upper back pain  Breast hypertrophy in female - Plan: Ambulatory referral to Plastic Surgery  At least 150 minutes of moderate exercise per week, daily brisk walking for 15-30 min is a good exercise option. Healthy diet low in saturated (animal) fats and sweets and consisting of fresh fruits and vegetables, lean meats such as fish and white chicken and whole grains.  These are some of recommendations for screening depending of age and risk factors:  - Vaccines:  Tdap vaccine every 10 years.  Shingles vaccine recommended at age 35, could be given after 33 years of age but not sure about insurance coverage.   Pneumonia vaccines: Pneumovax at 65. Sometimes Pneumovax is giving earlier if history of smoking, lung disease,diabetes,kidney disease among some.  Screening for diabetes at age 83 and every 3 years.  Cervical cancer prevention:  Pap smear starts at 32 years of age and continues periodically until 33 years old in low risk women. Pap smear every 3 years between 2 and 46 years old. Pap smear every 3-5 years between women 30 and older if pap smear negative and HPV screening negative.   -Breast cancer: Mammogram: There is disagreement between experts about when to start screening in low risk asymptomatic female but recent recommendations are to start screening at 26 and not later than 33 years old , every 1-2 years and after 33 yo q 2 years. Screening is recommended until 34 years old but some women can continue screening depending of healthy issues.  Colon cancer screening: starts at 33 years old until 33 years old.  Cholesterol disorder screening at age 28 and  every 3 years.  Also recommended:  1. Dental visit- Brush and floss your teeth twice daily; visit your dentist twice a year. 2. Eye doctor- Get an eye exam at least every 2 years. 3. Helmet use- Always wear a helmet when riding a bicycle, motorcycle, rollerblading or skateboarding. 4. Safe sex- If you may be exposed to sexually transmitted infections, use a condom. 5. Seat belts- Seat belts can save your live; always wear one. 6. Smoke/Carbon Monoxide detectors- These detectors need to be installed on the appropriate level of your home. Replace batteries at least once a year. 7. Skin cancer- When out in the sun please cover up and use sunscreen 15 SPF or higher. 8. Violence- If anyone is threatening or hurting you, please tell your healthcare provider.  9. Drink alcohol in moderation- Limit alcohol intake to one drink or less per day. Never drink and drive.

## 2019-12-30 NOTE — Progress Notes (Signed)
HPI:  [name] is a 34 y.o. female, who is here today for her routine physical.  Last CPE: 07/16/2018.  Regular exercise 3 or more time per week: Not regularly but she has an active job. Following a healthful diet: No She lives with her mother.  Chronic medical problems: OSA,back pain,seizure disorder,and obesity among some. She follows with neurologist. She has not had a seizure episode in years. She did not tolerate CPAP.  Pap smear: About 2 years ago. She follows with gyn regularly and planning on arranging appt.   Birth control:No, she is not sexually active.   There is no immunization history on file for this patient.  Mammogram: 10/21/18 because breast tenderness.Bi-RADS 1. Colonoscopy: N/A DEXA: N/A  Concerns today:  Upper back pain, chronic. She thinks is may be caused or aggravated by breast size,so would like to pursue breast reduction.   Review of Systems  Constitutional: Negative for appetite change, fatigue and fever.  HENT: Negative for dental problem, hearing loss, mouth sores, sore throat, trouble swallowing and voice change.   Eyes: Negative for redness and visual disturbance.  Respiratory: Negative for cough, shortness of breath and wheezing.   Cardiovascular: Negative for chest pain and leg swelling.  Gastrointestinal: Negative for abdominal pain, nausea and vomiting.       No changes in bowel habits.  Endocrine: Negative for cold intolerance, heat intolerance, polydipsia, polyphagia and polyuria.  Genitourinary: Negative for decreased urine volume, dysuria, hematuria, vaginal bleeding and vaginal discharge.  Musculoskeletal: Negative for arthralgias, gait problem and myalgias. +Upper back pain. Skin: Negative for color change and rash.  Allergic/Immunologic: Negative for environmental allergies.  Neurological: Negative for syncope, weakness and headaches.  Hematological: Negative for adenopathy. Does not bruise/bleed easily.    Psychiatric/Behavioral: Negative for confusion and sleep disturbance. The patient is not nervous/anxious.   All other systems reviewed and are negative.   Current Outpatient Medications on File Prior to Visit  Medication Sig Dispense Refill  . levETIRAcetam (KEPPRA) 500 MG tablet TAKE 1 TABLETS BY MOUTH IN THE MORNING AND 2 TABLETS NIGHTLY    . topiramate (TOPAMAX) 50 MG tablet Take 2 tablets by mouth twice daily     No current facility-administered medications on file prior to visit.     Past Medical History:  Diagnosis Date  . Seizures (HCC)     History reviewed. No pertinent surgical history.  No Known Allergies  Family History  Problem Relation Age of Onset  . Diabetes Mother   . Hypertension Mother   . Diabetes Father     Social History   Socioeconomic History  . Marital status: Single    Spouse name: Not on file  . Number of children: Not on file  . Years of education: Not on file  . Highest education level: Not on file  Occupational History  . Not on file  Tobacco Use  . Smoking status: Never Smoker  . Smokeless tobacco: Never Used  Substance and Sexual Activity  . Alcohol use: No  . Drug use: No  . Sexual activity: Not Currently  Other Topics Concern  . Not on file  Social History Narrative  . Not on file   Social Determinants of Health   Financial Resource Strain:   . Difficulty of Paying Living Expenses:   Food Insecurity:   . Worried About Programme researcher, broadcasting/film/video in the Last Year:   . Barista in the Last Year:   [employer]  Needs:   . Lack of Transportation (Medical):   Marland Kitchen Lack of Transportation (Non-Medical):   Physical Activity:   . Days of Exercise per Week:   . Minutes of Exercise per Session:   Stress:   . Feeling of Stress :   Social Connections:   . Frequency of Communication with Friends and Family:   . Frequency of Social Gatherings with Friends and Family:   . Attends Religious Services:   . Active Member of Clubs or  Organizations:   . Attends Banker Meetings:   Marland Kitchen Marital Status:      Vitals:   12/30/19 0934  BP: 118/66  Pulse: 79  Resp: 12  Temp: 97.7 F (36.5 C)  SpO2: 98%   Body mass index is 41.24 kg/m.   Wt Readings from Last 3 Encounters:  12/30/19 255 lb 8 oz (115.9 kg)  10/11/18 245 lb 4 oz (111.2 kg)  07/16/18 254 lb 2 oz (115.3 kg)     Physical Exam  Nursing note and vitals reviewed. Constitutional: She is oriented to person, place, and time. She appears well-developed. No distress.  HENT:  Head: Normocephalic and atraumatic.  Right Ear: Hearing, tympanic membrane, external ear and ear canal normal.  Left Ear: Hearing, tympanic membrane, external ear and ear canal normal.  Mouth/Throat: Uvula is midline, oropharynx is clear and moist and mucous membranes are normal.  Eyes: Pupils are equal, round, and reactive to light. Conjunctivae and EOM are normal.  Neck: No tracheal deviation present. No thyromegaly present.  Cardiovascular: Normal rate and regular rhythm.  No murmur heard. Pulses:      Dorsalis pedis pulses are 2+ on the right side, and 2+ on the left side.  Respiratory: Effort normal and breath sounds normal. No respiratory distress.  GI: Soft. She exhibits no mass. There is no hepatomegaly. There is no tenderness.  Genitourinary:Comments: Deferred to gyn.  Musculoskeletal: She exhibits no edema.  No major deformity or signs of synovitis appreciated.  Lymphadenopathy:    She has no cervical adenopathy.       Right: No supraclavicular adenopathy present.       Left: No supraclavicular adenopathy present.  Neurological: She is alert and oriented to person, place, and time. She has normal strength. No cranial nerve deficit. Coordination and gait normal.  Reflex Scores:      Bicep reflexes are 2+ on the right side and 2+ on the left side.      Patellar reflexes are 2+ on the right side and 2+ on the left side. Skin: Skin is warm. No rash noted. No  erythema.  Psychiatric: She has a normal mood and affect. Cognitive function grossly intact. Well groomed, good eye contact.   ASSESSMENT AND PLAN:  [name] was here today annual physical examination.  Orders Placed This Encounter  Procedures  . Basic metabolic panel  . Lipid panel  . Hemoglobin A1c  . Ambulatory referral to Plastic Surgery   Lab Results  Component Value Date   CHOL 178 12/30/2019   HDL 36.90 (L) 12/30/2019   LDLCALC 129 (H) 12/30/2019   TRIG 62.0 12/30/2019   CHOLHDL 5 12/30/2019   Lab Results  Component Value Date   CREATININE 0.78 12/30/2019   BUN 14 12/30/2019   NA 139 12/30/2019   K 4.3 12/30/2019   CL 107 12/30/2019   CO2 26 12/30/2019   Lab Results  Component Value Date   HGBA1C 5.6 12/30/2019   Routine general medical examination  at a health care facility We discussed the importance of regular physical activity and healthy diet for prevention of chronic illness and/or complications. Preventive guidelines reviewed. Vaccination up to date. She will continue following with gynecologist for her female preventive care.  Next CPE in a year.  Screening for lipoid disorders -     Lipid panel  Screening for endocrine, metabolic and immunity disorder -     Basic metabolic panel -     Hemoglobin A1c  Chronic upper back pain Local massage and ice/heat. Bra type could aggravate pain,not sure about breast size been an etiologic factor.  Breast hypertrophy in female -     Ambulatory referral to Plastic Surgery  Morbid obesity [facility]) We discussed benefits of wt loss as well as adverse effects of obesity. Consistency with healthy diet and physical activity recommended. 15 min daily walking recommended or 10,000 steps daily.  Seizure ([place]) Well controlled. Continue following with neuro.  Return in 1 year (on 12/29/2020).    Kenae Lindquist G. [place], MD  [facility]. [place] office.  Today you have you routine preventive  visit. A few things to remember from today's visit:   Routine general medical examination at a health care facility  Screening for lipoid disorders - Plan: Lipid panel  Screening for endocrine, metabolic and immunity disorder - Plan: Basic metabolic panel, Hemoglobin A1c  Chronic upper back pain  Breast hypertrophy in female - Plan: Ambulatory referral to Plastic Surgery  At least 150 minutes of moderate exercise per week, daily brisk walking for 15-30 min is a good exercise option. Healthy diet low in saturated (animal) fats and sweets and consisting of fresh fruits and vegetables, lean meats such as fish and white chicken and whole grains.  These are some of recommendations for screening depending of age and risk factors:  - Vaccines:  Tdap vaccine every 10 years.  Shingles vaccine recommended at age 30, could be given after 33 years of age but not sure about insurance coverage.   Pneumonia vaccines: Pneumovax at 50. Sometimes Pneumovax is giving earlier if history of smoking, lung disease,diabetes,kidney disease among some.  Screening for diabetes at age 69 and every 3 years.  Cervical cancer prevention:  Pap smear starts at 33 years of age and continues periodically until 33 years old in low risk women. Pap smear every 3 years between 59 and 46 years old. Pap smear every 3-5 years between women 23 and older if pap smear negative and HPV screening negative.   -Breast cancer: Mammogram: There is disagreement between experts about when to start screening in low risk asymptomatic female but recent recommendations are to start screening at 52 and not later than 33 years old , every 1-2 years and after 33 yo q 2 years. Screening is recommended until 33 years old but some women can continue screening depending of healthy issues.  Colon cancer screening: starts at 33 years old until 33 years old.  Cholesterol disorder screening at age 33 and every 3 years.  Also  recommended:  1. Dental visit- Brush and floss your teeth twice daily; visit your dentist twice a year. 2. Eye doctor- Get an eye exam at least every 2 years. 3. Helmet use- Always wear a helmet when riding a bicycle, motorcycle, rollerblading or skateboarding. 4. Safe sex- If you may be exposed to sexually transmitted infections, use a condom. 5. Seat belts- Seat belts can save your live; always wear one. 6. Smoke/Carbon Monoxide detectors- These detectors need to be  installed on the appropriate level of your home. Replace batteries at least once a year. 7. Skin cancer- When out in the sun please cover up and use sunscreen 15 SPF or higher. 8. Violence- If anyone is threatening or hurting you, please tell your healthcare provider.  9. Drink alcohol in moderation- Limit alcohol intake to one drink or less per day. Never drink and drive.

## 2019-12-31 ENCOUNTER — Encounter: Payer: Self-pay | Admitting: Family Medicine

## 2019-12-31 DIAGNOSIS — R569 Unspecified convulsions: Secondary | ICD-10-CM | POA: Insufficient documentation

## 2020-03-06 ENCOUNTER — Ambulatory Visit: Payer: BC Managed Care – PPO | Admitting: Plastic Surgery

## 2020-03-06 ENCOUNTER — Other Ambulatory Visit: Payer: Self-pay

## 2020-03-06 ENCOUNTER — Encounter: Payer: Self-pay | Admitting: Plastic Surgery

## 2020-03-06 VITALS — BP 125/83 | HR 79 | Temp 98.2°F | Ht 66.0 in | Wt 244.8 lb

## 2020-03-06 DIAGNOSIS — N62 Hypertrophy of breast: Secondary | ICD-10-CM | POA: Diagnosis not present

## 2020-03-06 DIAGNOSIS — M542 Cervicalgia: Secondary | ICD-10-CM

## 2020-03-06 DIAGNOSIS — G8929 Other chronic pain: Secondary | ICD-10-CM | POA: Diagnosis not present

## 2020-03-06 DIAGNOSIS — M545 Low back pain, unspecified: Secondary | ICD-10-CM

## 2020-03-06 NOTE — Progress Notes (Signed)
Patient ID: Brooke Alexander, female    DOB: 28-Sep-1985, 34 y.o.   MRN: [id number]   Chief Complaint  Patient presents with  . Advice Only  . Breast Problem    Mammary Hyperplasia: The patient is a 34 y.o. female with a history of mammary hyperplasia for several years.  She has extremely large breasts causing symptoms that include the following: Back pain in the upper and lower back, including neck pain. She pulls or pins her bra straps to provide better lift and relief of the pressure and pain. She notices relief by holding her breast up manually.  Her shoulder straps cause grooves and pain and pressure that requires padding for relief. Pain medication is sometimes required with motrin and tylenol.  Activities that are hindered by enlarged breasts include: exercise and running.   Her breasts are extremely large and fairly symmetric.  She has hyperpigmentation of the inframammary area on both sides.  The sternal to nipple distance on the right is 42 cm and the left is 41 cm.  The IMF distance is 20 cm.  She is 5 feet 6 inches tall and weighs 244 pounds.  Preoperative bra size = 42 H cup.  She would like to be anything smaller.  She states she could not be too small.  The estimated excess breast tissue to be removed at the time of surgery = 900 grams on the left and 900 grams on the right.  Mammogram history: She had a mammogram February 2020 and it was.  She had this because she was having a little bit of breast pain.  There was no areas of concern.  She is not a smoker and does not have diabetes.  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  She does not have any kids she has not done physical therapy or chiropractics.  She is willing to do physical therapy.   Review of Systems  Constitutional: Positive for activity change. Negative for appetite change.  HENT: Negative.   Eyes: Negative.   Respiratory: Negative for chest tightness and shortness of breath.   Cardiovascular: Negative for leg  swelling.  Gastrointestinal: Negative for abdominal pain.  Endocrine: Negative.   Genitourinary: Negative.   Musculoskeletal: Positive for back pain and neck pain.  Skin: Positive for color change. Negative for wound.  Hematological: Negative.   Psychiatric/Behavioral: Negative.     Past Medical History:  Diagnosis Date  . Seizures (HCC)     History reviewed. No pertinent surgical history.    Current Outpatient Medications:  .  levETIRAcetam (KEPPRA) 500 MG tablet, TAKE 1 TABLETS BY MOUTH IN THE MORNING AND 2 TABLETS NIGHTLY, Disp: , Rfl:  .  topiramate (TOPAMAX) 50 MG tablet, Take 2 tablets by mouth twice daily, Disp: , Rfl:    Objective:   Vitals:   03/06/20 0923  BP: 125/83  Pulse: 79  Temp: 98.2 F (36.8 C)  SpO2: 98%    Physical Exam Vitals and nursing note reviewed.  Constitutional:      Appearance: Normal appearance.  HENT:     Head: Normocephalic and atraumatic.  Eyes:     Extraocular Movements: Extraocular movements intact.  Cardiovascular:     Rate and Rhythm: Normal rate.     Pulses: Normal pulses.  Pulmonary:     Effort: Pulmonary effort is normal.  Abdominal:     General: Abdomen is flat. There is no distension.     Tenderness: There is no abdominal tenderness.  Skin:  General: Skin is warm.  Neurological:     General: No focal deficit present.     Mental Status: She is alert.  Psychiatric:        Mood and Affect: Mood normal.        Behavior: Behavior normal.     Assessment & Plan:  Chronic bilateral low back pain without sciatica  Neck pain  Symptomatic mammary hypertrophy  The patient is a very good candidate for bilateral breast reduction with possible lateral liposuction. We discussed that there may be a decreased ability to breast-feed in the future, loss of nipple areolas and scarring. We will email her the brochure with more information . Also recommend physical therapy. pictures were obtained of the patient and placed in  the chart with the patient's or guardian's permission.   Alena Bills Ravneet Spilker, DO

## 2020-03-20 ENCOUNTER — Other Ambulatory Visit: Payer: Self-pay

## 2020-03-20 ENCOUNTER — Ambulatory Visit: Payer: BC Managed Care – PPO | Attending: Plastic Surgery

## 2020-03-20 DIAGNOSIS — M542 Cervicalgia: Secondary | ICD-10-CM

## 2020-03-20 DIAGNOSIS — G8929 Other chronic pain: Secondary | ICD-10-CM | POA: Diagnosis present

## 2020-03-20 DIAGNOSIS — N62 Hypertrophy of breast: Secondary | ICD-10-CM | POA: Diagnosis not present

## 2020-03-20 DIAGNOSIS — M62838 Other muscle spasm: Secondary | ICD-10-CM | POA: Insufficient documentation

## 2020-03-20 DIAGNOSIS — M545 Low back pain: Secondary | ICD-10-CM | POA: Diagnosis present

## 2020-03-20 NOTE — Therapy (Signed)
[facility] [address] [place], [place], [address] Phone: [phone]   Fax:  [phone]  Physical Therapy Evaluation  Patient Details  Name: Brooke Alexander MRN: [id number] Date of Birth: [birth year] Referring Provider (PT): Peggye Form, DO   Encounter Date: 03/20/2020   PT End of Session - 03/20/20 2253    Visit Number 1    Number of Visits 7    Date for PT Re-Evaluation 05/08/20    Authorization Type BCBS COMM PPO    PT Start Time 1446    PT Stop Time 1531    PT Time Calculation (min) 45 min    Activity Tolerance Patient tolerated treatment well    Behavior During Therapy [facility] for tasks assessed/performed           Past Medical History:  Diagnosis Date  . Seizures (HCC)     History reviewed. No pertinent surgical history.  There were no vitals filed for this visit.    Subjective Assessment - 03/20/20 1500    Subjective Pt reports neck, upper shoulder, mid back and low back pain which she has had for 10+ years. She states the pain is always there.    How long can you sit comfortably? no issue    How long can you stand comfortably? no issue    How long can you walk comfortably? no issue    Patient Stated Goals To have less pain    Currently in Pain? Yes    Pain Score 5     Pain Location Back   neck   Pain Orientation Posterior;Upper;Mid;Lower    Pain Descriptors / Indicators Aching    Pain Type Chronic pain    Pain Onset Other (comment)    Pain Frequency Constant    Aggravating Factors  Hurts all the time    Pain Relieving Factors Lying on her back    Effect of Pain on Daily Activities Lives through the pain              [facility] PT Assessment - 03/20/20 0001      Assessment   Medical Diagnosis Neck pain; Chronic bilateral low back pain without sciatica; Symptomatic mammary hypertrophy    Referring Provider (PT) Dillingham, Alena Bills, DO    Onset Date/Surgical Date --   10+ years   Hand  Dominance Left    Next MD Visit not yet scheduled      Precautions   Precautions None      Restrictions   Weight Bearing Restrictions No      Balance Screen   Has the patient fallen in the past 6 months No      Home Environment   Living Environment Private residence    Living Arrangements Alone    Type of Home Apartment    Home Access Level entry    Home Layout One level      Prior Function   Level of Independence Independent    Vocation Full time employment    Herbalist grocery shopper - reaching, bending , walking      Cognition   Overall Cognitive Status Within Functional Limits for tasks assessed      Observation/Other Assessments   Focus on Therapeutic Outcomes (FOTO)  NA      Sensation   Light Touch Appears Intact      Posture/Postural Control   Posture/Postural Control Postural limitations    Postural Limitations Forward head;Rounded Shoulders      ROM /  Strength   AROM / PROM / Strength AROM;Strength      AROM   Overall AROM Comments Cervical AROM were found to WNLs with pt experiencing symptoms of  muscle pulling discomfort of the cervical, upper trap, and mid back musculature      Strength   Overall Strength Comments UE myotomal screen was negative      Palpation   Palpation comment Pt was TTP of the cervical, upper shoulder, and mid back musculature trigger points of increased sensivtivity palpated       Transfers   Transfers Sit to Stand;Stand to Sit    Sit to Stand 7: Independent      Ambulation/Gait   Ambulation/Gait Yes    Ambulation/Gait Assistance 7: Independent    Gait Pattern Within Functional Limits                      Objective measurements completed on examination: See above findings.               PT Education - 03/20/20 2252    Education Details Eval findings; POC, HEP for cervical, upper shoulder, and mid back ROM an flexibility, use of tennis ball and /or massage cane for back massage     Person(s) Educated Patient    Methods Explanation;Demonstration;Tactile cues;Herbert cues;Handout    Comprehension Verbalized understanding;Returned demonstration;Keiffer cues required;Tactile cues required;Need further instruction            PT Short Term Goals - 03/20/20 2315      PT SHORT TERM GOAL #1   Title pt will be Ind in an initial HEP    Baseline stared today    Time 3    Period Weeks    Status New    Target Date 04/10/20      PT SHORT TERM GOAL #2   Title Pt will voice understanding of measures to assist in pain reduction and management    Time 3    Period Weeks    Status New    Target Date 04/10/20             PT Long Term Goals - 03/20/20 2318      PT LONG TERM GOAL #1   Title Pt will report a reduction in enck, upper shoulder mid and low back pain to 3/10 or less with daily activities    Baseline 5/10    Time 7    Period Weeks    Status New    Target Date 05/08/20      PT LONG TERM GOAL #2   Title Pt's neck, upper shoulder, mid and low back musculature will demonstrate decreased sensivity to palpation of these areas.    Baseline Significantly sensitive    Time 7    Period Weeks    Status New    Target Date 05/08/20      PT LONG TERM GOAL #3   Title Pt will be Ind in a final HEP    Time 7    Period Weeks    Status New    Target Date 05/08/20      PT LONG TERM GOAL #5   Period Weeks    Status New    Target Date 05/08/20                  Plan - 03/20/20 2255    Clinical Impression Statement Pt pesents with a Hx for an extended time frame for neck, upper shoulders, mid  back and low back pain. Pain and increased muscle tension patterns are consistent with mammary hypertrophy. Pt will benefit from PT to address ROM, stretching, and strengthneing exs for these areas, as well as education in meassures to help reduce and manage pain and muscle tension.    Personal Factors and Comorbidities Past/Current Experience;Time since onset of  injury/illness/exacerbation    Stability/Clinical Decision Making Stable/Uncomplicated    Clinical Decision Making Low    Rehab Potential Fair    PT Frequency 1x / week    PT Duration 6 weeks    PT Treatment/Interventions Cryotherapy;Electrical Stimulation;Traction;Moist Heat;Iontophoresis 4mg /ml Dexamethasone;Therapeutic activities;Therapeutic exercise;Manual techniques;Patient/family education;Taping;Dry needling    PT Next Visit Plan Assess response to HEP and massage c tennis ball. Add stretching and strengthneing exs as tolerated.    PT Home Exercise Plan [id number]:HEP for cervical, upper shoulder, and mid back ROM an flexibility    Consulted and Agree with Plan of Care Patient           Patient will benefit from skilled therapeutic intervention in order to improve the following deficits and impairments:  Postural dysfunction, Pain, Obesity, Increased muscle spasms  Visit Diagnosis: Symptomatic mammary hypertrophy - Plan: PT plan of care cert/re-cert  Chronic bilateral low back pain without sciatica - Plan: PT plan of care cert/re-cert  Neck pain - Plan: PT plan of care cert/re-cert  Muscle spasms of neck - Plan: PT plan of care cert/re-cert     Problem List Patient Active Problem List   Diagnosis Date Noted  . Neck pain 03/06/2020  . 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 Seizure (HCC) 12/31/2019  . Back pain 07/28/2017  . Morbid obesity (HCC) 07/28/2017  . OSA (obstructive sleep apnea) 07/28/2017    07/30/2017 MS, PT 03/20/20 11:29 PM  [facility] [address] [place], [place], [place] Phone: [phone]   Fax:  [phone]  Name: Brooke Alexander MRN: Cheri Guppy Date of Birth: 1986-07-09

## 2020-03-30 ENCOUNTER — Other Ambulatory Visit: Payer: Self-pay

## 2020-03-30 ENCOUNTER — Ambulatory Visit: Payer: BC Managed Care – PPO

## 2020-03-30 DIAGNOSIS — N62 Hypertrophy of breast: Secondary | ICD-10-CM | POA: Diagnosis not present

## 2020-03-30 DIAGNOSIS — G8929 Other chronic pain: Secondary | ICD-10-CM

## 2020-03-30 DIAGNOSIS — M542 Cervicalgia: Secondary | ICD-10-CM

## 2020-03-30 DIAGNOSIS — M62838 Other muscle spasm: Secondary | ICD-10-CM

## 2020-03-30 NOTE — Therapy (Signed)
[facility] Outpatient Rehabilitation [facility] [address] [place], [place], [address] Phone: [phone]   Fax:  [phone]  Physical Therapy Treatment  Patient Details  Name: Brooke Alexander MRN: [id number] Date of Birth: 29-Dec-1985 Referring Provider (PT): Peggye Form, DO   Encounter Date: 03/30/2020   PT End of Session - 03/30/20 1558    Visit Number 2    Number of Visits 7    Date for PT Re-Evaluation 05/08/20    Authorization Type BCBS COMM PPO    PT Start Time 1130    PT Stop Time 1214    PT Time Calculation (min) 44 min    Activity Tolerance Patient tolerated treatment well    Behavior During Therapy [facility] for tasks assessed/performed           Past Medical History:  Diagnosis Date  . Seizures (HCC)     History reviewed. No pertinent surgical history.  There were no vitals filed for this visit.   Subjective Assessment - 03/30/20 1142    Subjective pt reports she is completing her exs daily. She states the scapular retraction ex helps relieve mid back stress during the day. Pt reports her overall back pain is the same.    Currently in Pain? Yes    Pain Score 5     Pain Location Back    Pain Orientation Posterior;Upper;Mid;Lower    Pain Descriptors / Indicators Aching    Pain Type Chronic pain    Pain Onset Other (comment)   years   Pain Frequency Constant    Aggravating Factors  Hurts all the time    Pain Relieving Factors lying on back    Effect of Pain on Daily Activities Lives through the pain                             [facility] Adult PT Treatment/Exercise - 03/30/20 0001      Self-Care   Self-Care Other Self-Care Comments    Other Self-Care Comments  HEP: Review of current HEP and instruction in additional stretching and strengthening exs; Review of use of tennis ball for  muscle/trigger point massage      Exercises   Exercises Lumbar;Neck      Neck Exercises: Standing   Neck Retraction 10 reps;3 secs        Neck Exercises: Seated   Neck Retraction --    Other Seated Exercise --      Lumbar Exercises: Stretches   Quadruped Mid Back Stretch 5 reps;20 seconds    Quadruped Mid Back Stretch Limitations reach through; rotate up    Other Lumbar Stretch Exercise child pose; forward and lateral 2x; 20 sec stretch    Other Lumbar Stretch Exercise cat/camel 3x; 10 sec      Lumbar Exercises: Supine   Pelvic Tilt 10 reps    Pelvic Tilt Limitations 3 sec    Bent Knee Raise 10 reps    Bent Knee Raise Limitations 3 sets      Lumbar Exercises: Quadruped   Single Arm Raise Right;Left;10 reps;2 seconds    Straight Leg Raise 10 reps;2 seconds      Shoulder Exercises: Standing   Retraction Strengthening;Both;10 reps                  PT Education - 03/30/20 1545    Education Details HEP: Review of current HEP and instruction in additional stretching and strengthening exs. Review for use of  tennis ball for muscle/trigger point massage    Person(s) Educated Patient    Methods Explanation;Demonstration;Tactile cues;Scalia cues;Handout    Comprehension Verbalized understanding;Returned demonstration;[place] cues required;Tactile cues required            PT Short Term Goals - 03/20/20 2315      PT SHORT TERM GOAL #1   Title pt will be Ind in an initial HEP    Baseline stared today    Time 3    Period Weeks    Status New    Target Date 04/10/20      PT SHORT TERM GOAL #2   Title Pt will voice understanding of measures to assist in pain reduction and management    Time 3    Period Weeks    Status New    Target Date 04/10/20             PT Long Term Goals - 03/20/20 2318      PT LONG TERM GOAL #1   Title Pt will report a reduction in enck, upper shoulder mid and low back pain to 3/10 or less with daily activities    Baseline 5/10    Time 7    Period Weeks    Status New    Target Date 05/08/20      PT LONG TERM GOAL #2   Title Pt's neck, upper shoulder, mid and low back  musculature will demonstrate decreased sensivity to palpation of these areas.    Baseline Significantly sensitive    Time 7    Period Weeks    Status New    Target Date 05/08/20      PT LONG TERM GOAL #3   Title Pt will be Ind in a final HEP    Time 7    Period Weeks    Status New    Target Date 05/08/20      PT LONG TERM GOAL #5   Period Weeks    Status New    Target Date 05/08/20                 Plan - 03/30/20 1551    Clinical Impression Statement Pt has experienced some temporary relief of mid back pain c her HEP. Pt reports consistent completion of her HEP. Additional, back/scapular strengthening ex were added to her HEP.    Personal Factors and Comorbidities Past/Current Experience;Time since onset of injury/illness/exacerbation    Stability/Clinical Decision Making Stable/Uncomplicated    Clinical Decision Making Low    Rehab Potential Fair    PT Frequency 1x / week    PT Duration 6 weeks    PT Treatment/Interventions Cryotherapy;Electrical Stimulation;Traction;Moist Heat;Iontophoresis 4mg /ml Dexamethasone;Therapeutic activities;Therapeutic exercise;Manual techniques;Patient/family education;Taping;Dry needling    PT Next Visit Plan Assess response to HEP and massage c tennis ball. Adjust pt's ther ex program as indicated.    PT Home Exercise Plan [id number] for cervical, upper shoulder, and mid back ROM an flexibility. Quadruped-arm lifts and leg kicks.    Consulted and Agree with Plan of Care Patient           Patient will benefit from skilled therapeutic intervention in order to improve the following deficits and impairments:  Postural dysfunction, Pain, Obesity, Increased muscle spasms  Visit Diagnosis: Symptomatic mammary hypertrophy  Chronic bilateral low back pain without sciatica  Neck pain  Muscle spasms of neck     Problem List Patient Active Problem List   Diagnosis Date Noted  . Neck pain 03/06/2020  .  Symptomatic mammary hypertrophy  03/06/2020  . Seizure (HCC) 12/31/2019  . Back pain 07/28/2017  . Morbid obesity (HCC) 07/28/2017  . OSA (obstructive sleep apnea) 07/28/2017   Joellyn Rued MS, PT 03/30/20 4:01 PM  [facility] Outpatient Rehabilitation [facility] [address] [place], [place], [address] Phone: [phone]   Fax:  [phone]  Name: Brooke Alexander MRN: [id number] Date of Birth: May 11, 1986

## 2020-04-02 ENCOUNTER — Other Ambulatory Visit: Payer: Self-pay

## 2020-04-02 ENCOUNTER — Ambulatory Visit: Payer: BC Managed Care – PPO

## 2020-04-03 ENCOUNTER — Ambulatory Visit: Payer: BC Managed Care – PPO | Attending: Plastic Surgery

## 2020-04-03 DIAGNOSIS — M545 Low back pain: Secondary | ICD-10-CM | POA: Diagnosis present

## 2020-04-03 DIAGNOSIS — G8929 Other chronic pain: Secondary | ICD-10-CM | POA: Insufficient documentation

## 2020-04-03 DIAGNOSIS — M542 Cervicalgia: Secondary | ICD-10-CM | POA: Insufficient documentation

## 2020-04-03 DIAGNOSIS — M62838 Other muscle spasm: Secondary | ICD-10-CM | POA: Diagnosis present

## 2020-04-03 DIAGNOSIS — N62 Hypertrophy of breast: Secondary | ICD-10-CM | POA: Insufficient documentation

## 2020-04-03 NOTE — Therapy (Signed)
[facility] Outpatient Rehabilitation [facility] [address] [place], [place], [address] Phone: [phone]   Fax:  [phone]  Physical Therapy Treatment  Patient Details  Name: Brooke Alexander MRN: [id number] Date of Birth: 1986/03/10 Referring Provider (PT): Peggye Form, DO   Encounter Date: 04/03/2020   PT End of Session - 04/03/20 1614    Visit Number 3    Number of Visits 7    Date for PT Re-Evaluation 05/08/20    Authorization Type BCBS COMM PPO    PT Start Time 0415    PT Stop Time 0504    PT Time Calculation (min) 49 min    Activity Tolerance Patient tolerated treatment well    Behavior During Therapy [facility] for tasks assessed/performed           Past Medical History:  Diagnosis Date  . Seizures (HCC)     History reviewed. No pertinent surgical history.  There were no vitals filed for this visit.   Subjective Assessment - 04/03/20 1618    Pain Score 5     Pain Location Back    Pain Orientation Posterior;Lower;Mid    Pain Descriptors / Indicators Aching    Pain Type Chronic pain    Pain Onset More than a month ago    Pain Frequency Constant    Aggravating Factors  always hurts                             [facility] Adult PT Treatment/Exercise - 04/03/20 0001      Neck Exercises: Theraband   Shoulder Extension 10 reps    Shoulder Extension Limitations yellow    Rows 10 reps    Rows Limitations yellow      Neck Exercises: Sidelying   Other Sidelying Exercise arm openings x 6 RT/LT       Lumbar Exercises: Aerobic   Nustep UE/LE L4 5 min      Lumbar Exercises: Quadruped   Single Arm Raise Right;Left;10 reps;2 seconds    Straight Leg Raise 10 reps;2 seconds      Modalities   Modalities Moist Heat      Moist Heat Therapy   Number Minutes Moist Heat 10 Minutes    Moist Heat Location Lumbar Spine   thoracic  prone     Manual Therapy   Manual Therapy Joint mobilization;Soft tissue mobilization    Joint  Mobilization Gr 2-3 PA glides  30 reps progressin up spine centralk and lateral rwith rotation and side glides LT and RT.            All HEP was reviewed and she wa able tpo do all correctly but needed tactile and Hochberg cues to do corrrectly         PT Short Term Goals - 03/20/20 2315      PT SHORT TERM GOAL #1   Title pt will be Ind in an initial HEP    Baseline stared today    Time 3    Period Weeks    Status New    Target Date 04/10/20      PT SHORT TERM GOAL #2   Title Pt will voice understanding of measures to assist in pain reduction and management    Time 3    Period Weeks    Status New    Target Date 04/10/20             PT Long Term Goals -  03/20/20 2318      PT LONG TERM GOAL #1   Title Pt will report a reduction in enck, upper shoulder mid and low back pain to 3/10 or less with daily activities    Baseline 5/10    Time 7    Period Weeks    Status New    Target Date 05/08/20      PT LONG TERM GOAL #2   Title Pt's neck, upper shoulder, mid and low back musculature will demonstrate decreased sensivity to palpation of these areas.    Baseline Significantly sensitive    Time 7    Period Weeks    Status New    Target Date 05/08/20      PT LONG TERM GOAL #3   Title Pt will be Ind in a final HEP    Time 7    Period Weeks    Status New    Target Date 05/08/20      PT LONG TERM GOAL #5   Period Weeks    Status New    Target Date 05/08/20                 Plan - 04/03/20 1614    Clinical Impression Statement No changes so far.  She is able to do the HEP.   Sine fairly supple with glides. tender more in lower thoracic spine.  Continue manual and maybe add some more core strength. She reported feeling less pain and looser post session    PT Treatment/Interventions Cryotherapy;Electrical Stimulation;Traction;Moist Heat;Iontophoresis 4mg /ml Dexamethasone;Therapeutic activities;Therapeutic exercise;Manual techniques;Patient/family  education;Taping;Dry needling    PT Next Visit Plan Add some core strenght in addition to already issued. manual and modalities for pain.    PT Home Exercise Plan [id number] for cervical, upper shoulder, and mid back ROM an flexibility. Quadruped-arm lifts and leg kicks.    Consulted and Agree with Plan of Care Patient           Patient will benefit from skilled therapeutic intervention in order to improve the following deficits and impairments:  Postural dysfunction, Pain, Obesity, Increased muscle spasms  Visit Diagnosis: Chronic bilateral low back pain without sciatica  Symptomatic mammary hypertrophy  Neck pain  Muscle spasms of neck     Problem List Patient Active Problem List   Diagnosis Date Noted  . Neck pain 03/06/2020  . 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 Seizure (HCC) 12/31/2019  . Back pain 07/28/2017  . Morbid obesity (HCC) 07/28/2017  . OSA (obstructive sleep apnea) 07/28/2017    07/30/2017  PT 04/03/2020, 5:03 PM  [facility] [address] [place], [place], [place] Phone: [phone]   Fax:  [phone]  Name: Brooke Alexander MRN: Cheri Guppy Date of Birth: 05-25-1986

## 2020-04-04 ENCOUNTER — Ambulatory Visit: Payer: BC Managed Care – PPO

## 2020-04-11 ENCOUNTER — Other Ambulatory Visit: Payer: Self-pay

## 2020-04-11 ENCOUNTER — Emergency Department (HOSPITAL_COMMUNITY)
Admission: EM | Admit: 2020-04-11 | Discharge: 2020-04-11 | Disposition: A | Discharge status: Home / Self Care | Payer: BC Managed Care – PPO | Attending: Emergency Medicine | Admitting: Emergency Medicine

## 2020-04-11 ENCOUNTER — Ambulatory Visit: Payer: BC Managed Care – PPO

## 2020-04-11 ENCOUNTER — Encounter (HOSPITAL_COMMUNITY): Payer: Self-pay | Admitting: Emergency Medicine

## 2020-04-11 ENCOUNTER — Emergency Department (HOSPITAL_COMMUNITY): Payer: BC Managed Care – PPO

## 2020-04-11 DIAGNOSIS — J069 Acute upper respiratory infection, unspecified: Secondary | ICD-10-CM | POA: Insufficient documentation

## 2020-04-11 DIAGNOSIS — Z20822 Contact with and (suspected) exposure to covid-19: Secondary | ICD-10-CM | POA: Insufficient documentation

## 2020-04-11 DIAGNOSIS — R519 Headache, unspecified: Secondary | ICD-10-CM | POA: Diagnosis present

## 2020-04-11 LAB — BASIC METABOLIC PANEL
Anion gap: 9 (ref 5–15)
BUN: 11 mg/dL (ref 6–20)
CO2: 23 mmol/L (ref 22–32)
Calcium: 8.8 mg/dL — ABNORMAL LOW (ref 8.9–10.3)
Chloride: 107 mmol/L (ref 98–111)
Creatinine, Ser: 0.75 mg/dL (ref 0.44–1.00)
GFR calc Af Amer: >60 mL/min (ref >60)
GFR calc non Af Amer: >60 mL/min (ref >60)
Glucose, Bld: 86 mg/dL (ref 70–99)
Potassium: 3.5 mmol/L (ref 3.5–5.1)
Sodium: 139 mmol/L (ref 135–145)

## 2020-04-11 LAB — CBC
HCT: 42.9 % (ref 36.0–46.0)
Hemoglobin: 13.9 g/dL (ref 12.0–15.0)
MCH: 29.7 pg (ref 26.0–34.0)
MCHC: 32.4 g/dL (ref 30.0–36.0)
MCV: 91.7 fL (ref 80.0–100.0)
Platelets: 311 10*3/uL (ref 150–400)
RBC: 4.68 MIL/uL (ref 3.87–5.11)
RDW: 12.9 % (ref 11.5–15.5)
WBC: 5.6 10*3/uL (ref 4.0–10.5)
nRBC: 0 % (ref 0.0–0.2)

## 2020-04-11 LAB — SARS CORONAVIRUS 2 BY RT PCR (HOSPITAL ORDER, PERFORMED IN ~~LOC~~ HOSPITAL LAB): SARS Coronavirus 2: NEGATIVE

## 2020-04-11 LAB — TROPONIN I (HIGH SENSITIVITY): Troponin I (High Sensitivity): <2 ng/L (ref <18)

## 2020-04-11 LAB — I-STAT BETA HCG BLOOD, ED (MC, WL, AP ONLY): I-stat hCG, quantitative: <5 m[IU]/mL (ref <5)

## 2020-04-11 MED ORDER — LIDOCAINE VISCOUS HCL 2 % MT SOLN
15.0000 mL | Freq: Once | OROMUCOSAL | Status: AC
  Administered 2020-04-11: 15 mL via ORAL
  Filled 2020-04-11: qty 15

## 2020-04-11 MED ORDER — IBUPROFEN 200 MG PO TABS
600.0000 mg | ORAL_TABLET | Freq: Once | ORAL | Status: AC
  Administered 2020-04-11: 600 mg via ORAL
  Filled 2020-04-11: qty 3

## 2020-04-11 MED ORDER — AEROCHAMBER Z-STAT PLUS/MEDIUM MISC
1.0000 | Freq: Once | Status: AC
  Administered 2020-04-11: 1
  Filled 2020-04-11: qty 1

## 2020-04-11 MED ORDER — ALUM & MAG HYDROXIDE-SIMETH 200-200-20 MG/5ML PO SUSP
30.0000 mL | Freq: Once | ORAL | Status: AC
  Administered 2020-04-11: 30 mL via ORAL
  Filled 2020-04-11: qty 30

## 2020-04-11 MED ORDER — ALBUTEROL SULFATE HFA 108 (90 BASE) MCG/ACT IN AERS
4.0000 | INHALATION_SPRAY | Freq: Once | RESPIRATORY_TRACT | Status: AC
  Administered 2020-04-11: 4 via RESPIRATORY_TRACT
  Filled 2020-04-11: qty 6.7

## 2020-04-11 NOTE — ED Provider Notes (Signed)
[facility]-EMERGENCY DEPT Provider Note   CSN: [id number] Arrival date & time: 04/11/20  1227     History Chief Complaint  Patient presents with  . Headache  . Shortness of Breath    when lays down or wearing a face mask    Brooke Alexander is a 34 y.o. female with a history of seizures on Keppra and Topamax who presents to the emergency department with a chief complaint of chest pain.  The patient has been having intermittent episodes of radiating chest pain for the last 2 days.  She characterizes the pain as predominantly tightness, but she does sometimes feel as if it is burning.  Pain is worse when she lays flat, which causes her to feel short of breath.  She is unsure how long the episodes last.  Pain is not pleuritic, exertional, or associated with eating.  No other known aggravating or alleviating factors.  She reports that she was unable to sleep for most of the night due to the pain.  She notes that she also feels short of breath when wearing a mask, but aside from laying flat and wearing a mask has not had any shortness of breath.  States that she had a similar pain previously when she was diagnosed with a URI, but reports this was several years ago.  She reports that she developed a bilateral frontal headache around 2 days ago after the chest pain began.  She also feels as if her throat is dry, but it is nonpainful and she denies dysphagia.  She does have a history of allergies reports that she has had some recent sneezing and nasal congestion.  She denies fever, chills, palpitations, leg swelling, cough, back pain, dizziness, lightheadedness, neck pain or stiffness, rash, orthopnea, PND, visual changes, weakness, numbness, N/V/D, abdominal pain, or urinary complaints.   No treatment for her complaints prior to arrival.  She has not received her COVID-19 vaccination.  She works in Engineering geologist.  She denies any known sick contacts.  She does not take birth control and has  no concerns for pregnancy at this time.  New familial personal history of PE.  No recent immobilization or long travel.  She has been compliant with her home Keppra and Topamax.  No recent breakthrough seizures.  The history is provided by the patient and medical records. No language interpreter was used.    HPI: A 34 year old patient with a history of obesity presents for evaluation of chest pain. Initial onset of pain was less than one hour ago. The patient's chest pain is described as heaviness/pressure/tightness and is not worse with exertion. The patient's chest pain is middle- or left-sided, is not well-localized, is not sharp and does not radiate to the arms/jaw/neck. The patient does not complain of nausea and denies diaphoresis. The patient has no history of stroke, has no history of peripheral artery disease, has not smoked in the past 90 days, denies any history of treated diabetes, has no relevant family history of coronary artery disease (first degree relative at less than age 75), is not hypertensive and has no history of hypercholesterolemia.   Past Medical History:  Diagnosis Date  . Seizures [facility])     Patient Active Problem List   Diagnosis Date Noted  . Neck pain 03/06/2020  . 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 Seizure (HCC) 12/31/2019  . Back pain 07/28/2017  . Morbid obesity (HCC) 07/28/2017  . OSA (obstructive sleep apnea) 07/28/2017    History reviewed. No  pertinent surgical history.   OB History   No obstetric history on file.     Family History  Problem Relation Age of Onset  . Diabetes Mother   . Hypertension Mother   . Diabetes Father     Social History   Tobacco Use  . Smoking status: Never Smoker  . Smokeless tobacco: Never Used  Vaping Use  . Vaping Use: Never used  Substance Use Topics  . Alcohol use: No  . Drug use: No    Home Medications Prior to Admission medications   Medication Sig Start Date End Date Taking? Authorizing  Provider  levETIRAcetam (KEPPRA) 500 MG tablet Take 500-1,000 mg by mouth 2 (two) times daily.  09/28/19  Yes [provider]  topiramate (TOPAMAX) 50 MG tablet Take 2 tablets by mouth twice daily 12/22/19  Yes [provider]    Allergies    Patient has no known allergies.  Review of Systems   Review of Systems  Constitutional: Negative for activity change, chills and fever.  HENT: Positive for congestion. Negative for ear discharge, ear pain, postnasal drip, sinus pressure, sinus pain, trouble swallowing and voice change.   Eyes: Negative for visual disturbance.  Respiratory: Positive for shortness of breath. Negative for cough and wheezing.   Cardiovascular: Positive for chest pain. Negative for palpitations and leg swelling.  Gastrointestinal: Negative for abdominal pain, diarrhea, nausea and vomiting.  Genitourinary: Negative for dysuria.  Musculoskeletal: Negative for back pain, myalgias, neck pain and neck stiffness.  Skin: Negative for rash.  Allergic/Immunologic: Negative for immunocompromised state.  Neurological: Positive for headaches. Negative for dizziness, weakness and numbness.  Psychiatric/Behavioral: Negative for confusion.    Physical Exam Updated Vital Signs BP 130/74   Pulse 78   Temp 98.3 F (36.8 C) (Oral)   Resp 15   Wt 103.1 kg   LMP 04/09/2020   SpO2 100%   BMI 36.70 kg/m   Physical Exam Vitals and nursing note reviewed.  Constitutional:      General: She is not in acute distress.    Appearance: She is obese. She is not ill-appearing, toxic-appearing or diaphoretic.     Comments: Well-appearing.  No acute distress.  Nontoxic-appearing.  HENT:     Head: Normocephalic.     Right Ear: Ear canal and external ear normal.     Left Ear: Ear canal and external ear normal.     Nose: Congestion present. No rhinorrhea.     Mouth/Throat:     Mouth: Mucous membranes are moist.     Pharynx: No oropharyngeal exudate or posterior  oropharyngeal erythema.  Eyes:     General:        Right eye: No discharge.        Left eye: No discharge.     Conjunctiva/sclera: Conjunctivae normal.  Neck:     Comments: No meningismus Cardiovascular:     Rate and Rhythm: Normal rate and regular rhythm.     Pulses: Normal pulses.     Heart sounds: Normal heart sounds. No murmur heard.  No friction rub. No gallop.   Pulmonary:     Effort: Pulmonary effort is normal. No respiratory distress.     Breath sounds: No stridor. No wheezing, rhonchi or rales.     Comments: Lungs are clear to auscultation bilaterally without increased work of breathing. Chest:     Chest wall: No tenderness.  Abdominal:     General: There is no distension.     Palpations: Abdomen is  soft. There is no mass.     Tenderness: There is no abdominal tenderness. There is no right CVA tenderness, left CVA tenderness, guarding or rebound.     Hernia: No hernia is present.     Comments: Abdomen soft, nontender, nondistended.  Musculoskeletal:     Cervical back: Neck supple.     Right lower leg: No edema.     Left lower leg: No edema.  Skin:    General: Skin is warm.     Findings: No rash.  Neurological:     Mental Status: She is alert.  Psychiatric:        Behavior: Behavior normal.    ED Results / Procedures / Treatments   Labs (all labs ordered are listed, but only abnormal results are displayed) Labs Reviewed  BASIC METABOLIC PANEL - Abnormal; Notable for the following components:      Result Value   Calcium 8.8 (*)    All other components within normal limits  SARS CORONAVIRUS 2 BY RT PCR (HOSPITAL ORDER, PERFORMED IN [facility])  CBC  I-STAT BETA HCG BLOOD, ED ([facility], [facility], [facility] ONLY)  TROPONIN I (HIGH SENSITIVITY)    EKG EKG Interpretation  Date/Time:  Wednesday April 11 2020 21:22:12 EDT Ventricular Rate:  71 PR Interval:    QRS Duration: 99 QT Interval:  390 QTC Calculation: 424 R Axis:   52 Text Interpretation: Sinus  rhythm NO STEMI Confirmed by Alvester Chou [phone]) on 04/11/2020 9:29:12 PM   Radiology DG Chest 2 View  Result Date: 04/11/2020 CLINICAL DATA:  Shortness of breath with headaches for 2 days. EXAM: CHEST - 2 VIEW COMPARISON:  Radiographs 04/08/2014. FINDINGS: The heart size and mediastinal contours are normal. The lungs are clear. There is no pleural effusion or pneumothorax. No acute osseous findings are identified. IMPRESSION: Stable chest.  No active cardiopulmonary process. Electronically Signed   By: Carey Bullocks M.D.   On: 04/11/2020 15:13    Procedures Procedures (including critical care time)  Medications Ordered in ED Medications  ibuprofen (ADVIL) tablet 600 mg (600 mg Oral Given 04/11/20 1934)  alum & mag hydroxide-simeth (MAALOX/MYLANTA) 200-200-20 MG/5ML suspension 30 mL (30 mLs Oral Given 04/11/20 1933)    And  lidocaine (XYLOCAINE) 2 % viscous mouth solution 15 mL (15 mLs Oral Given 04/11/20 1933)  albuterol (VENTOLIN HFA) 108 (90 Base) MCG/ACT inhaler 4 puff (4 puffs Inhalation Given 04/11/20 1933)  aerochamber Z-Stat Plus/medium 1 each (1 each Other Given 04/11/20 1934)    ED Course  I have reviewed the triage vital signs and the nursing notes.  Pertinent labs & imaging results that were available during my care of the patient were reviewed by me and considered in my medical decision making (see chart for details).    MDM Rules/Calculators/A&P HEAR Score: 2                        34 year old female with a history of epilepsy who presents with 2 days of intermittent chest pain that causes shortness of breath when she wears a mask or lays flat and headaches.  She has recently had sneezing and nasal congestion.  Symptoms feel similar to when she previously had a URI.   Vital signs are normal.  Chest x-ray has been reviewed by me and is unremarkable.  We will add on EKG and check a single troponin as well as basic screening labs.  We will also add on a COVID-19 test.  That she does characterize the pain as somewhat burning will give her a GI cocktail as well as an albuterol inhaler since she reported that when she previously had a URI that albuterol helped with her symptoms.  However, her lungs are clear and are not diminished on exam.  We will also administer ibuprofen for headache.   I am concerned that she has a URI, specifically COVID-19.  Doubt PE as she is PERC negative.  EKG with normal sinus rhythm.  Initial troponin is negative. HEAR score is 2.  Repeat troponin is not indicated.  Doubt ACS.  COVID-19 test is negative.  She has no electrolyte derangements.  Doubt pericarditis, myocarditis, tension pneumothorax.  Given her constellation of symptoms, suspect viral URI.  I have a low suspicion for aortic dissection.  On reevaluation, she feels much better after albuterol inhaler and headache has resolved with ibuprofen.  Will discharge home with symptomatic management.  Recommended PCP follow-up if symptoms do not improve.  ER return precautions given.  She is hemodynamically stable and in no acute distress.  Safe for discharge home with outpatient follow-up as indicated.  Final Clinical Impression(s) / ED Diagnoses Final diagnoses:  Viral URI    Rx / DC Orders ED Discharge Orders    None       Barkley BoardsMcDonald, Chlora Mcbain A, PA-C 04/11/20 2148    Terald Sleeperrifan, Matthew J, MD 04/12/20 0100

## 2020-04-11 NOTE — Discharge Instructions (Addendum)
Thank you for allowing me to care for you today in the Emergency Department.   You were seen today for a headache, shortness of breath with laying flat wearing a mask, and chest pain.  Your COVID-19 test was negative.  Your work-up was otherwise reassuring.  Take 650 mg of Tylenol or 600 mg of ibuprofen with food every 6 hours for headache or pain.  You can alternate between these 2 medications every 3 hours if your pain returns.  For instance, you can take Tylenol at noon, followed by a dose of ibuprofen at 3, followed by second dose of Tylenol and 6.  You can use 4 puffs of the albuterol inhaler every 4 hours as needed for chest tightness or shortness of breath.  Make sure that you are drinking plenty of fluids.  Follow-up with primary care if your symptoms do not significantly improve within the next week.  Return to the ER if you develop significant trouble breathing/respiratory distress, if your fingers or lips turn blue, if you pass out, if you become unable to move your neck, develop shortness of breath with high fevers, or other new, concerning symptoms.

## 2020-04-11 NOTE — ED Triage Notes (Signed)
Pt has intermittent headache over the past 2 days. Denies taking anything for pains.  Pt states that when she lays down or wearing a mask feels like having a difficulty time breathing.

## 2020-04-13 ENCOUNTER — Other Ambulatory Visit: Payer: BC Managed Care – PPO

## 2020-04-18 ENCOUNTER — Other Ambulatory Visit: Payer: Self-pay

## 2020-04-18 ENCOUNTER — Encounter: Payer: Self-pay | Admitting: Physical Therapy

## 2020-04-18 ENCOUNTER — Ambulatory Visit: Payer: BC Managed Care – PPO | Admitting: Physical Therapy

## 2020-04-18 DIAGNOSIS — M545 Low back pain: Secondary | ICD-10-CM | POA: Diagnosis not present

## 2020-04-18 DIAGNOSIS — M542 Cervicalgia: Secondary | ICD-10-CM

## 2020-04-18 DIAGNOSIS — M62838 Other muscle spasm: Secondary | ICD-10-CM

## 2020-04-18 DIAGNOSIS — N62 Hypertrophy of breast: Secondary | ICD-10-CM

## 2020-04-18 DIAGNOSIS — G8929 Other chronic pain: Secondary | ICD-10-CM

## 2020-04-18 NOTE — Therapy (Signed)
[place], [place], [address] Phone: [phone]   Fax:  [phone]  Physical Therapy Treatment  Patient Details  Name: Brooke Alexander MRN: [id number] Date of Birth: 1986/08/17 Referring Provider (PT): Wallace Going, DO   Encounter Date: 04/18/2020   PT End of Session - 04/18/20 1501    Visit Number 4    Number of Visits 7    Date for PT Re-Evaluation 05/08/20    Authorization Type BCBS COMM PPO    PT Start Time 1501    PT Stop Time 1542    PT Time Calculation (min) 41 min    Activity Tolerance Patient tolerated treatment well           Past Medical History:  Diagnosis Date  . Seizures ([place])     History reviewed. No pertinent surgical history.  There were no vitals filed for this visit.   Subjective Assessment - 04/18/20 1502    Subjective "I've been doing okayy, I haven't had any time to do my exercises. still having the same pain."    Patient Stated Goals To have less pain    Currently in Pain? Yes    Pain Score 4     Pain Location Thoracic    Pain Orientation Mid    Pain Descriptors / Indicators Aching;Sore              [facility] PT Assessment - 04/18/20 0001      Assessment   Medical Diagnosis Neck pain; Chronic bilateral low back pain without sciatica; Symptomatic mammary hypertrophy    Referring Provider (PT) Dillingham, Loel Lofty, DO                         [facility] Adult PT Treatment/Exercise - 04/18/20 0001      Lumbar Exercises: Supine   Pelvic Tilt 1 rep;10 reps;5 seconds    Dead Bug 10 reps   combined with posterior pelvic tilt with ab set     Lumbar Exercises: Quadruped   Opposite Arm/Leg Raise Right arm/Left leg;Left arm/Right leg;20 reps      Shoulder Exercises: Supine   Other Supine Exercises thoracic extension over foam 2 x 10 with hands behind head and elbows adducted      Shoulder Exercises: Seated   Row Strengthening;15 reps;Theraband   x 2  sets   Theraband Level (Shoulder Row) Level 2 (Red)    Other Seated Exercises scapular retraction with ER 2 x 15 with red theraband      Shoulder Exercises: ROM/Strengthening   UBE (Upper Arm Bike) L1 x 5 min   bwd only     Neck Exercises: Stretches   Upper Trapezius Stretch 1 rep;Left;Right;30 seconds    Chest Stretch 30 seconds;3 reps   PNF contract/ relax   Other Neck Stretches rhomboid stretch 2 x 30 secon                  PT Education - 04/18/20 1507    Education Details Reviewed HEP and updated today and discussed importance of consistency with her exercises and appropriate frequency    Person(s) Educated Patient    Methods Explanation;[place] cues;Handout    Comprehension Verbalized understanding;[place] cues required            PT Short Term Goals - 04/18/20 1541      PT SHORT TERM GOAL #1   Title pt will be Ind in an initial HEP  Period Weeks    Status Partially Met      PT SHORT TERM GOAL #2   Title Pt will voice understanding of measures to assist in pain reduction and management    Period Weeks    Status Partially Met             PT Long Term Goals - 03/20/20 2318      PT LONG TERM GOAL #1   Title Pt will report a reduction in enck, upper shoulder mid and low back pain to 3/10 or less with daily activities    Baseline 5/10    Time 7    Period Weeks    Status New    Target Date 05/08/20      PT LONG TERM GOAL #2   Title Pt's neck, upper shoulder, mid and low back musculature will demonstrate decreased sensivity to palpation of these areas.    Baseline Significantly sensitive    Time 7    Period Weeks    Status New    Target Date 05/08/20      PT LONG TERM GOAL #3   Title Pt will be Ind in a final HEP    Time 7    Period Weeks    Status New    Target Date 05/08/20      PT LONG TERM GOAL #5   Period Weeks    Status New    Target Date 05/08/20                 Plan - 04/18/20 1527    Clinical Impression Statement pt  arrives reproting limited compliance with her HEP due to work. continued stretching pecs, rhomboids and upper traps and strengthening of the core and posterior shoulder musculature. end of session she noted feeling looser and decreased pain.    PT Treatment/Interventions Cryotherapy;Electrical Stimulation;Traction;Moist Heat;Iontophoresis 4mg/ml Dexamethasone;Therapeutic activities;Therapeutic exercise;Manual techniques;Patient/family education;Taping;Dry needling    PT Next Visit Plan Add some core strenght in addition to already issued. manual and modalities for pain.    PT Home Exercise Plan [id number] for cervical, upper shoulder, and mid back ROM an flexibility. Quadruped-arm lifts and leg kicks.    Consulted and Agree with Plan of Care Patient           Patient will benefit from skilled therapeutic intervention in order to improve the following deficits and impairments:  Postural dysfunction, Pain, Obesity, Increased muscle spasms  Visit Diagnosis: Chronic bilateral low back pain without sciatica  Symptomatic mammary hypertrophy  Neck pain  Muscle spasms of neck     Problem List Patient Active Problem List   Diagnosis Date Noted  . Neck pain 03/06/2020  . 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 Seizure ([facility]) 12/31/2019  . Back pain 07/28/2017  . Morbid obesity (H[facility]) 07/28/2017  . OSA (obstructive sleep apnea) 07/28/2017   Kristoffer Leamon PT, DPT, LAT, ATC  04/18/20  3:42 PM      [facility] [address] [place], [place], [address] Phone: [phone]   Fax:  [phone]  Name: Brooke Alexander MRN: [id number] Date of Birth: 07/02/1986   

## 2020-04-25 ENCOUNTER — Encounter: Payer: Self-pay | Admitting: Physical Therapy

## 2020-04-25 ENCOUNTER — Other Ambulatory Visit: Payer: Self-pay

## 2020-04-25 ENCOUNTER — Ambulatory Visit: Payer: BC Managed Care – PPO | Admitting: Physical Therapy

## 2020-04-25 DIAGNOSIS — N62 Hypertrophy of breast: Secondary | ICD-10-CM

## 2020-04-25 DIAGNOSIS — M545 Low back pain: Secondary | ICD-10-CM | POA: Diagnosis not present

## 2020-04-25 DIAGNOSIS — M542 Cervicalgia: Secondary | ICD-10-CM

## 2020-04-25 DIAGNOSIS — M62838 Other muscle spasm: Secondary | ICD-10-CM

## 2020-04-25 DIAGNOSIS — G8929 Other chronic pain: Secondary | ICD-10-CM

## 2020-04-25 NOTE — Therapy (Addendum)
[place], [place], [address] Phone: [phone]   Fax:  [phone]  Physical Therapy Treatment  Patient Details  Name: Brooke Alexander MRN: [id number] Date of Birth: May 14, 1986 Referring Provider (PT): Wallace Going, DO   Encounter Date: 04/25/2020   PT End of Session - 04/25/20 1459    Visit Number 5    Number of Visits 7    Date for PT Re-Evaluation 05/08/20    Authorization Type BCBS COMM PPO    PT Start Time 1500    PT Stop Time 1539    PT Time Calculation (min) 39 min    Activity Tolerance Patient tolerated treatment well    Behavior During Therapy [facility] for tasks assessed/performed           Past Medical History:  Diagnosis Date  . Seizures ([place])     History reviewed. No pertinent surgical history.  There were no vitals filed for this visit.   Subjective Assessment - 04/25/20 1500    Subjective " I am doing pretty good"    Currently in Pain? No/denies    Pain Score 0-No pain    Aggravating Factors  prolonged standing/ walking    Pain Relieving Factors laying back              [facility] PT Assessment - 04/25/20 0001      Assessment   Medical Diagnosis Neck pain; Chronic bilateral low back pain without sciatica; Symptomatic mammary hypertrophy    Referring Provider (PT) Dillingham, Loel Lofty, DO                         [facility] Adult PT Treatment/Exercise - 04/25/20 0001      Lumbar Exercises: Aerobic   Elliptical L5 x 6 min ramp L2      Lumbar Exercises: Standing   Other Standing Lumbar Exercises hip abduction/ extension 2 x 20 ea. with red theraband around ankles      Lumbar Exercises: Supine   Dead Bug 10 reps    Other Supine Lumbar Exercises pressing red physioball with bil UE down agains legs 1 x 10 exhaling while pressing down and inhaling with release.       Shoulder Exercises: Standing   Horizontal ABduction Strengthening;Both;15 reps;Theraband     Theraband Level (Shoulder Horizontal ABduction) Level 2 (Red)    Extension Strengthening;Both;15 reps;Theraband    Theraband Level (Shoulder Extension) Level 3 (Green)   x 2 sets   Row Strengthening;Both;15 reps;Theraband    Theraband Level (Shoulder Row) Level 3 (Green)    Other Standing Exercises scapular retraction with ER  2x 20 with gren band with                  PT Education - 04/25/20 1526    Education Details Reviewed and updated HEP today for hip and shoulder strengthening.    Person(s) Educated Patient    Methods Explanation;[place] cues;Handout    Comprehension Verbalized understanding;[place] cues required            PT Short Term Goals - 04/18/20 1541      PT SHORT TERM GOAL #1   Title pt will be Ind in an initial HEP    Period Weeks    Status Partially Met      PT SHORT TERM GOAL #2   Title Pt will voice understanding of measures to assist in pain reduction and management    Period Weeks  Status Partially Met             PT Long Term Goals - 03/20/20 2318      PT LONG TERM GOAL #1   Title Pt will report a reduction in enck, upper shoulder mid and low back pain to 3/10 or less with daily activities    Baseline 5/10    Time 7    Period Weeks    Status New    Target Date 05/08/20      PT LONG TERM GOAL #2   Title Pt's neck, upper shoulder, mid and low back musculature will demonstrate decreased sensivity to palpation of these areas.    Baseline Significantly sensitive    Time 7    Period Weeks    Status New    Target Date 05/08/20      PT LONG TERM GOAL #3   Title Pt will be Ind in a final HEP    Time 7    Period Weeks    Status New    Target Date 05/08/20      PT LONG TERM GOAL #5   Period Weeks    Status New    Target Date 05/08/20                 Plan - 04/25/20 1530    Clinical Impression Statement pt reports no pain today and reports consistency with her HERP. continued working on hip/ knee strengthening with  increased reps to promote endurance. she did well with core strengthening but does fatigue quickly. end of session she report no pain during or following session.    PT Treatment/Interventions Cryotherapy;Electrical Stimulation;Traction;Moist Heat;Iontophoresis 13m/ml Dexamethasone;Therapeutic activities;Therapeutic exercise;Manual techniques;Patient/family education;Taping;Dry needling    PT Next Visit Plan Core and posterior shoulder strengtheing to promote efficient posture.    PT Home Exercise Plan [id number] for cervical, upper shoulder, and mid back ROM an flexibility. Quadruped-arm lifts and leg kicks. standing hip abduction/ extension, Rows/ extension    Consulted and Agree with Plan of Care Patient           Patient will benefit from skilled therapeutic intervention in order to improve the following deficits and impairments:  Postural dysfunction, Pain, Obesity, Increased muscle spasms  Visit Diagnosis: Chronic bilateral low back pain without sciatica  Symptomatic mammary hypertrophy  Neck pain  Muscle spasms of neck     Problem List Patient Active Problem List   Diagnosis Date Noted  . Neck pain 03/06/2020  . 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 Seizure ([place] 12/31/2019  . Back pain 07/28/2017  . Morbid obesity ([place] 07/28/2017  . OSA (obstructive sleep apnea) 07/28/2017     KStarr LakePT, DPT, LAT, ATC  04/25/20  3:41 PM      [facility] [address]Phone: [phone]  Fax:  [phone] Name: Brooke McavoyMRN: [id number] of Birth: 9September 03, 1987

## 2020-05-02 ENCOUNTER — Other Ambulatory Visit: Payer: Self-pay

## 2020-05-02 ENCOUNTER — Ambulatory Visit: Payer: BC Managed Care – PPO | Attending: Plastic Surgery

## 2020-05-02 DIAGNOSIS — M542 Cervicalgia: Secondary | ICD-10-CM | POA: Diagnosis present

## 2020-05-02 DIAGNOSIS — N62 Hypertrophy of breast: Secondary | ICD-10-CM | POA: Diagnosis present

## 2020-05-02 DIAGNOSIS — G8929 Other chronic pain: Secondary | ICD-10-CM | POA: Diagnosis present

## 2020-05-02 DIAGNOSIS — M545 Low back pain, unspecified: Secondary | ICD-10-CM

## 2020-05-02 DIAGNOSIS — M62838 Other muscle spasm: Secondary | ICD-10-CM | POA: Insufficient documentation

## 2020-05-03 NOTE — Therapy (Signed)
[place], [place], [address] Phone: [phone]   Fax:  [phone]  Physical Therapy Treatment  Patient Details  Name: Brooke Alexander MRN: [id number] Date of Birth: 07-Aug-1986 Referring Provider (PT): Wallace Going, DO   Encounter Date: 05/02/2020   PT End of Session - 05/03/20 1155    Visit Number 6    Number of Visits 7    Date for PT Re-Evaluation 05/08/20    Authorization Type BCBS COMM PPO    PT Start Time 1062    PT Stop Time 1533    PT Time Calculation (min) 46 min    Activity Tolerance Patient tolerated treatment well    Behavior During Therapy [facility] for tasks assessed/performed           Past Medical History:  Diagnosis Date  . Seizures ([place])     History reviewed. No pertinent surgical history.  There were no vitals filed for this visit.   Subjective Assessment - 05/02/20 1453    Subjective Pt reports overall PT has been helpful. Currently, she reports 4/10 lower cervical/upper trap pain with coming to PT after work.    Currently in Pain? Yes    Pain Score 4     Pain Location Thoracic   upper shoulders   Pain Orientation Mid    Pain Descriptors / Indicators Aching;Sore    Pain Type Chronic pain    Pain Onset More than a month ago    Aggravating Factors  prolonged standing/ walking    Pain Relieving Factors lying down    Effect of Pain on Daily Activities Lives with the pain                             [facility] Adult PT Treatment/Exercise - 05/03/20 0001      Neck Exercises: Machines for Strengthening   UBE (Upper Arm Bike) 5 mins; L1      Lumbar Exercises: Stretches   Quadruped Mid Back Stretch 3 reps;10 seconds    Quadruped Mid Back Stretch Limitations Through and up/out    Other Lumbar Stretch Exercise Child pose; forward and laterall 2x 15 sec      Lumbar Exercises: Standing   Other Standing Lumbar Exercises hip abduction/ extension 2 x 20 ea. with  red theraband around ankles      Lumbar Exercises: Supine   Pelvic Tilt 1 rep;10 reps;5 seconds    Dead Bug 5 reps    Dead Bug Limitations 3 sets      Lumbar Exercises: Quadruped   Madcat/Old Horse 5 reps    Madcat/Old Horse Limitations 10 sec      Shoulder Exercises: Standing   Horizontal ABduction Strengthening;Both;15 reps;Theraband    Theraband Level (Shoulder Horizontal ABduction) Level 2 (Red)    Extension Strengthening;Both;15 reps;Theraband    Theraband Level (Shoulder Extension) Level 3 (Green)   x 2 sets   Row Strengthening;Both;15 reps;Theraband    Theraband Level (Shoulder Row) Level 3 (Green)    Row Limitations 2 sets      Shoulder Exercises: IT sales professional 3 reps;20 seconds    Corner Stretch Limitations Doorway stretch                  PT Education - 05/03/20 1210    Education Details HEP progression of core strengthening            PT Short Term Goals -  04/18/20 1541      PT SHORT TERM GOAL #1   Title pt will be Ind in an initial HEP    Period Weeks    Status Partially Met      PT SHORT TERM GOAL #2   Title Pt will voice understanding of measures to assist in pain reduction and management    Period Weeks    Status Partially Met             PT Long Term Goals - 03/20/20 2318      PT LONG TERM GOAL #1   Title Pt will report a reduction in enck, upper shoulder mid and low back pain to 3/10 or less with daily activities    Baseline 5/10    Time 7    Period Weeks    Status New    Target Date 05/08/20      PT LONG TERM GOAL #2   Title Pt's neck, upper shoulder, mid and low back musculature will demonstrate decreased sensivity to palpation of these areas.    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 PT addressed anterior pectoral stretching with posterior chain strengthening, Core strengthening was completed and progression was added to her HEP for proper trunk strength and stability with daily activities.    Personal Factors and Comorbidities Past/Current Experience;Time since onset of injury/illness/exacerbation    Stability/Clinical Decision Making Stable/Uncomplicated    Clinical Decision Making Low    Rehab Potential Fair    PT Frequency 1x / week    PT Duration 6 weeks    PT Treatment/Interventions Cryotherapy;Electrical Stimulation;Traction;Moist Heat;Iontophoresis 34m/ml Dexamethasone;Therapeutic activities;Therapeutic exercise;Manual techniques;Patient/family education;Taping;Dry needling    PT Next Visit Plan Complete reassessment with DC or continuation of PT to be determined. Will review HEP for proper understanding of technique and purpose.    PT Home Exercise Plan [id number] for cervical, upper shoulder, and mid back ROM an flexibility. Quadruped-arm lifts and leg kicks. standing hip abduction/ extension, Rows/ extension, PPT, Dead bug    Consulted and Agree with Plan of Care Patient           Patient will benefit from skilled therapeutic intervention in order to improve the following deficits and impairments:  Postural dysfunction, Pain, Obesity, Increased muscle spasms  Visit Diagnosis: Symptomatic mammary hypertrophy  Chronic bilateral low back pain without sciatica  Neck pain  Muscle spasms of neck     Problem List Patient Active Problem List   Diagnosis Date Noted  . Neck pain 03/06/2020  . 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 [place] ([place] 12/31/2019  . Back pain 07/28/2017  . Morbid [place] ([place] 07/28/2017  . OSA  (obstructive sleep apnea) 07/28/2017   AGar PontoMS, PT 05/03/20 12:11 PM  [facility] [address] [place], [place], [address] Phone: [phone]   Fax:  [phone]  Name: Brenlee Koskela MRN: [id number] Date of Birth: [birth year]

## 2020-05-09 ENCOUNTER — Other Ambulatory Visit: Payer: Self-pay

## 2020-05-09 ENCOUNTER — Ambulatory Visit: Payer: BC Managed Care – PPO

## 2020-05-09 DIAGNOSIS — N62 Hypertrophy of breast: Secondary | ICD-10-CM

## 2020-05-09 DIAGNOSIS — M62838 Other muscle spasm: Secondary | ICD-10-CM

## 2020-05-09 DIAGNOSIS — G8929 Other chronic pain: Secondary | ICD-10-CM

## 2020-05-09 NOTE — Therapy (Signed)
[place], [place], [address] Phone: [phone]   Fax:  [phone]  Physical Therapy Treatment/Discharge Summary  Patient Details  Name: Brooke Alexander MRN: [id number] Date of Birth: 09/16/1985 Referring Provider (PT): Wallace Going, DO   Encounter Date: 05/09/2020   PT End of Session - 05/09/20 1530    Visit Number 7    Number of Visits 7    Date for PT Re-Evaluation 05/08/20    Authorization Type BCBS COMM PPO    PT Start Time 1449    PT Stop Time 1530    PT Time Calculation (min) 41 min    Activity Tolerance Patient tolerated treatment well    Behavior During Therapy [facility] for tasks assessed/performed           Past Medical History:  Diagnosis Date  . Seizures ([place])     History reviewed. No pertinent surgical history.  There were no vitals filed for this visit.   Subjective Assessment - 05/09/20 1457    Subjective Pt reports she has just gotten off of work and her neck/upper shoulder pain is a 4/10.    Patient Stated Goals To have less pain    Currently in Pain? Yes    Pain Score 4     Pain Location Thoracic   neck and upper shoulders   Pain Orientation Mid    Pain Descriptors / Indicators Aching    Pain Type Chronic pain    Pain Onset More than a month ago    Pain Frequency Constant    Aggravating Factors  prolonged standing/ walking/lifting    Pain Relieving Factors Lying down, rest    Effect of Pain on Daily Activities Lives with the pain                             [facility] Adult PT Treatment/Exercise - 05/09/20 0001      Neck Exercises: Seated   Neck Retraction 5 reps;3 secs      Lumbar Exercises: Stretches   Active Hamstring Stretch 60 seconds      Lumbar Exercises: Standing   Other Standing Lumbar Exercises hip abduction/ extension 2 x 20 ea. with red theraband around ankles      Lumbar Exercises: Supine   Pelvic Tilt 10 reps;5 seconds    Dead Bug  5 reps    Dead Bug Limitations 3 sets      Lumbar Exercises: Quadruped   Madcat/Old Horse 5 reps    Madcat/Old Horse Limitations 10 sec    Single Arm Raise 10 reps;2 seconds    Opposite Arm/Leg Raise Right arm/Left leg;Left arm/Right leg;10 reps;2 seconds      Shoulder Exercises: Seated   Retraction AROM;5 reps    Retraction Limitations 3 sec      Shoulder Exercises: Standing   Horizontal ABduction Strengthening;Both;15 reps;Theraband    Theraband Level (Shoulder Horizontal ABduction) Level 2 (Red)    Extension Strengthening;Both;15 reps;Theraband    Theraband Level (Shoulder Extension) Level 3 (Green)   x 2 sets   Row Strengthening;Both;15 reps;Theraband    Theraband Level (Shoulder Row) Level 3 (Green)    Row Limitations 2 sets      Shoulder Exercises: IT sales professional 3 reps;20 seconds    Corner Stretch Limitations Doorway stretch      Neck Exercises: Stretches   Upper Trapezius Stretch Right;Left;2 reps;20 seconds  PT Education - 05/09/20 1541    Education Details HEP was reviewed    Person(s) Educated Patient    Methods Explanation;Demonstration;[place] cues;[place] cues;Handout    Comprehension Returned demonstration;[place] cues required;Tactile cues required;Need further instruction;Verbalized understanding            PT Short Term Goals - 05/09/20 1553      PT SHORT TERM GOAL #1   Title pt will be Ind in an initial HEP. Achieved    Target Date 05/09/20      PT SHORT TERM GOAL #2   Title Pt will voice understanding of measures to assist in pain reduction and management. Achieved    Status Achieved    Target Date 05/09/20             PT Long Term Goals - 05/09/20 1558      PT LONG TERM GOAL #1   Title Pt will report a reduction in enck, upper shoulder mid and low back pain to 3/10 or less with daily activities Not met    Baseline 4/10    Status Not Met    Target Date 05/09/20      PT LONG TERM GOAL #2   Title Pt's  neck, upper shoulder, mid and low back musculature will demonstrate decreased sensivity to palpation of these areas. Pt reports decreased sensitivity of the neck, upper shoulders, and mid back with palpation being moderately sensitive.    Status Achieved    Target Date 05/09/20      PT LONG TERM GOAL #3   Title Pt will be Ind in a final HEP. Met    Status Achieved    Target Date 05/09/20                 Plan - 05/09/20 1543    Clinical Impression Statement Pt is DCed from PT reporting some improvement in her neck, upper shoulder and mid-back pain. Pt's HEP for posture, stretching of tight musculature and strengthening weak muscle was reviewed with pt returning demonstration. Pt is DCed from PT to continue with her HEP and pt is scheduled for breast reduction on 07/05/20.    Personal Factors and Comorbidities Past/Current Experience;Time since onset of injury/illness/exacerbation    Stability/Clinical Decision Making Unstable/Unpredictable    Clinical Decision Making Low    Rehab Potential Fair    PT Frequency 1x / week    PT Duration 6 weeks    PT Treatment/Interventions Cryotherapy;Electrical Stimulation;Traction;Moist Heat;Iontophoresis 100m/ml Dexamethasone;Therapeutic activities;Therapeutic exercise;Manual techniques;Patient/family education;Taping;Dry needling    PT Home Exercise Plan [id number] for cervical, upper shoulder, and mid back ROM an flexibility. Quadruped-arm lifts and leg kicks. standing hip abduction/ extension, Rows/ extension, PPT, Dead bug    Consulted and Agree with Plan of Care Patient           Patient will benefit from skilled therapeutic intervention in order to improve the following deficits and impairments:  Postural dysfunction, Pain, Obesity, Increased muscle spasms  Visit Diagnosis: Symptomatic mammary hypertrophy  Chronic bilateral low back pain without sciatica  Muscle spasms of neck     Problem List Patient Active Problem List    Diagnosis Date Noted  . Neck pain 03/06/2020  . 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 Seizure ([place] 12/31/2019  . Back pain 07/28/2017  . Morbid obesity ([place] 07/28/2017  . OSA (obstructive sleep apnea) 07/28/2017    AGar PontoMS, PT 05/09/20 4:09 PM   PHYSICAL THERAPY DISCHARGE SUMMARY  Visits from Start of Care: 7  Current functional level related to goals / functional outcomes: See above   Remaining deficits: See above   Education / Equipment: HEP Plan: Patient agrees to discharge.  Patient goals were not met. Patient is being discharged due to the patient's request.  ?????       [place], [place], [address] Phone: [phone]   Fax:  [phone]  Name: Daphane Odekirk MRN: [id number] Date of Birth: 1986/05/24

## 2020-05-15 ENCOUNTER — Ambulatory Visit: Payer: BC Managed Care – PPO

## 2020-06-15 ENCOUNTER — Ambulatory Visit (INDEPENDENT_AMBULATORY_CARE_PROVIDER_SITE_OTHER): Payer: BC Managed Care – PPO | Admitting: Surgical

## 2020-06-15 ENCOUNTER — Encounter: Payer: Self-pay | Admitting: Surgical

## 2020-06-15 ENCOUNTER — Other Ambulatory Visit: Payer: Self-pay

## 2020-06-15 VITALS — BP 126/87 | HR 94 | Temp 98.3°F | Ht 66.0 in | Wt 229.8 lb

## 2020-06-15 DIAGNOSIS — G8929 Other chronic pain: Secondary | ICD-10-CM

## 2020-06-15 DIAGNOSIS — N62 Hypertrophy of breast: Secondary | ICD-10-CM

## 2020-06-15 DIAGNOSIS — M545 Low back pain, unspecified: Secondary | ICD-10-CM

## 2020-06-15 DIAGNOSIS — M542 Cervicalgia: Secondary | ICD-10-CM

## 2020-06-15 MED ORDER — CEPHALEXIN 500 MG PO CAPS
500.0000 mg | ORAL_CAPSULE | Freq: Four times a day (QID) | ORAL | 0 refills | Status: AC
Start: 2020-06-15 — End: 2020-06-18

## 2020-06-15 MED ORDER — HYDROCODONE-ACETAMINOPHEN 5-325 MG PO TABS: 1.0000 | ORAL_TABLET | Freq: Four times a day (QID) | ORAL | 0 refills | Status: AC | PRN

## 2020-06-15 MED ORDER — ONDANSETRON HCL 4 MG PO TABS: 4.0000 mg | ORAL_TABLET | Freq: Three times a day (TID) | ORAL | 0 refills | Status: DC | PRN

## 2020-06-15 NOTE — Progress Notes (Signed)
Patient ID: Brooke Alexander, female    DOB: Apr 30, 1986, 34 y.o.   MRN: [id number]  Chief Complaint  Patient presents with  . Pre-op Exam      ICD-10-CM   1. Neck pain  M54.2   2. Chronic bilateral low back pain without sciatica  M54.50    G89.29   3. Symptomatic mammary hypertrophy  N62     History of Present Illness: Brooke Alexander is a 34 y.o.  female  with a history of macromastia.  She presents for preoperative evaluation for upcoming procedure, bilateral breast reduction with liposuction, scheduled for 07/05/2020 with Dr. Ulice Bold  No history of DVT/PE.  No family history of DVT/PE.  No family or personal history of bleeding or clotting disorders.  Patient is not currently taking any blood thinners.  No history of CVA/MI.  No past surgical history, no known familial complications with anesthesia.  No known knowledge of any malignant hyperthermia.  Summary of Previous Visit: STN on the right is 42 cm and the left is 41 cm, IMF is 20 cm.  Preop bra size equals 42 H cup.  Patient stated she would like to be anything smaller.  Estimated excess tissue to remove the time of surgery is 900 g on the left and 900 g on the right.  Patient is a non-smoker and nondiabetic. Patient is unsure of what size she would like to be, we briefly discussed this today and she would like to be somewhere within the C/D range.   Patient had mammogram 10/21/2018 with no mammographic evidence of malignancy.  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  Not on CPAP.    Past Medical History: Allergies: No Known Allergies  Current Medications:  Current Outpatient Medications:  .  levETIRAcetam (KEPPRA) 500 MG tablet, Take 500-1,000 mg by mouth 2 (two) times daily. , Disp: , Rfl:  .  topiramate (TOPAMAX) 50 MG tablet, Take 2 tablets by mouth twice daily, Disp: , Rfl:   Past Medical Problems: Past Medical History:  Diagnosis Date  . Seizures (HCC)     Past Surgical  History: History reviewed. No pertinent surgical history.  Social History: Social History   Socioeconomic History  . Marital status: Single    Spouse name: Not on file  . Number of children: Not on file  . Years of education: Not on file  . Highest education level: Not on file  Occupational History  . Not on file  Tobacco Use  . Smoking status: Never Smoker  . Smokeless tobacco: Never Used  Vaping Use  . Vaping Use: Never used  Substance and Sexual Activity  . Alcohol use: No  . Drug use: No  . Sexual activity: Not Currently  Other Topics Concern  . Not on file  Social History Narrative  . Not on file   Social Determinants of Health   Financial Resource Strain:   . Difficulty of Paying Living Expenses: Not on file  Food Insecurity:   . Worried About Programme researcher, broadcasting/film/video in the Last Year: Not on file  . Ran Out of Food in the Last Year: Not on file  Transportation Needs:   . Lack of Transportation (Medical): Not on file  . Lack of Transportation (Non-Medical): Not on file  Physical Activity:   . Days of Exercise per Week: Not on file  . Minutes of Exercise per Session: Not on file  Stress:   . Feeling of Stress : Not  on file  Social Connections:   . Frequency of Communication with Friends and Family: Not on file  . Frequency of Social Gatherings with Friends and Family: Not on file  . Attends Religious Services: Not on file  . Active Member of Clubs or Organizations: Not on file  . Attends Banker Meetings: Not on file  . Marital Status: Not on file  Intimate Partner Violence:   . Fear of Current or Ex-Partner: Not on file  . Emotionally Abused: Not on file  . Physically Abused: Not on file  . Sexually Abused: Not on file    Family History: Family History  Problem Relation Age of Onset  . Diabetes Mother   . Hypertension Mother   . Diabetes Father     Review of Systems: Review of Systems  Constitutional: Negative.   Respiratory: Negative.    Cardiovascular: Negative.   Gastrointestinal: Negative.   Neurological: Negative.     Physical Exam: Vital Signs BP 126/87 (BP Location: Left Arm, Patient Position: Sitting, Cuff Size: Large)   Pulse 94   Temp 98.3 F (36.8 C) (Oral)   Ht 5\' 6"  (1.676 m)   Wt 229 lb 12.8 oz (104.2 kg)   LMP 06/01/2020 (Approximate)   SpO2 98%   BMI 37.09 kg/m  Physical Exam Constitutional:      General: She is not in acute distress.    Appearance: Normal appearance. She is not ill-appearing.  HENT:     Head: Normocephalic and atraumatic.  Eyes:     Pupils: Pupils are equal, round Neck:     Musculoskeletal: Normal range of motion.  Cardiovascular:     Rate and Rhythm: Normal rate and regular rhythm.     Pulses: Normal pulses.     Heart sounds: Normal heart sounds. No murmur.  Pulmonary:     Effort: Pulmonary effort is normal. No respiratory distress.     Breath sounds: Normal breath sounds. No wheezing.  Abdominal:     General: Abdomen is flat. There is no distension.     Palpations: Abdomen is soft.     Tenderness: There is no abdominal tenderness.  Musculoskeletal: Normal range of motion.  Skin:    General: Skin is warm and dry.     Findings: No erythema or rash.  Neurological:     General: No focal deficit present.     Mental Status: She is alert and oriented to person, place, and time. Mental status is at baseline.     Motor: No weakness.  Psychiatric:        Mood and Affect: Mood normal.        Behavior: Behavior normal.    Assessment/Plan: The patient is scheduled for bilateral breast reduction with Dr. 08/01/2020.  Risks, benefits, and alternatives of procedure discussed, questions answered and consent obtained.    Smoking Status: Non-smoker; Counseling Given?  N/A Last Mammogram: 10/2018; Results: Negative  Caprini Score: 3, moderate; Risk Factors include: BMI greater than 25, and length of planned surgery. Recommendation for mechanical prophylaxis during surgery.  Encourage early ambulation.   Pictures obtained:[email or web address]   Post-op Rx sent to pharmacy: Norco, Zofran, Keflex  Patient was provided with the breast reduction and General Surgical Risk consent document and Pain Medication Agreement prior to their appointment.  They had adequate time to read through the risk consent documents and Pain Medication Agreement. We also discussed them in person together during this preop appointment. All of their questions were answered to their satisfaction.  Recommended  calling if they have any further questions.  Risk consent form and Pain Medication Agreement to be scanned into patient's chart.  The risk that can be encountered with breast reduction were discussed and include the following but not limited to these:  Breast asymmetry, fluid accumulation, firmness of the breast, inability to breast feed, loss of nipple or areola, skin loss, decrease or no nipple sensation, fat necrosis of the breast tissue, bleeding, infection, healing delay.  There are risks of anesthesia, changes to skin sensation and injury to nerves or blood vessels.  The muscle can be temporarily or permanently injured.  You may have an allergic reaction to tape, suture, glue, blood products which can result in skin discoloration, swelling, pain, skin lesions, poor healing.  Any of these can lead to the need for revisonal surgery or stage procedures.  A reduction has potential to interfere with diagnostic procedures.  Nipple or breast piercing can increase risks of infection.  This procedure is best done when the breast is fully developed.  Changes in the breast will continue to occur over time.  Pregnancy can alter the outcomes of previous breast reduction surgery, weight gain and weigh loss can also effect the long term appearance.   The risks that can be encountered with and after liposuction were discussed and include the following but no limited to these:  Asymmetry, fluid accumulation, firmness of the  area, fat necrosis with death of fat tissue, bleeding, infection, delayed healing, anesthesia risks, skin sensation changes, injury to structures including nerves, blood vessels, and muscles which may be temporary or permanent, allergies to tape, suture materials and glues, blood products, topical preparations or injected agents, skin and contour irregularities, skin discoloration and swelling, deep vein thrombosis, cardiac and pulmonary complications, pain, which may persist, persistent pain, recurrence of the lesion, poor healing of the incision, possible need for revisional surgery or staged procedures. Thiere can also be persistent swelling, poor wound healing, rippling or loose skin, worsening of cellulite, swelling, and thermal burn or heat injury from ultrasound with the ultrasound-assisted lipoplasty technique. Any change in weight fluctuations can alter the outcome.      Electronically signed by: Kermit Balo Greydon Betke, PA-C 06/15/2020 9:05 AM

## 2020-06-15 NOTE — H&P (View-Only) (Signed)
Patient ID: Brooke Alexander, female    DOB: Apr 30, 1986, 34 y.o.   MRN: [id number]  Chief Complaint  Patient presents with  . Pre-op Exam      ICD-10-CM   1. Neck pain  M54.2   2. Chronic bilateral low back pain without sciatica  M54.50    G89.29   3. Symptomatic mammary hypertrophy  N62     History of Present Illness: Brooke Alexander is a 34 y.o.  female  with a history of macromastia.  She presents for preoperative evaluation for upcoming procedure, bilateral breast reduction with liposuction, scheduled for 07/05/2020 with Dr. Ulice Bold  No history of DVT/PE.  No family history of DVT/PE.  No family or personal history of bleeding or clotting disorders.  Patient is not currently taking any blood thinners.  No history of CVA/MI.  No past surgical history, no known familial complications with anesthesia.  No known knowledge of any malignant hyperthermia.  Summary of Previous Visit: STN on the right is 42 cm and the left is 41 cm, IMF is 20 cm.  Preop bra size equals 42 H cup.  Patient stated she would like to be anything smaller.  Estimated excess tissue to remove the time of surgery is 900 g on the left and 900 g on the right.  Patient is a non-smoker and nondiabetic. Patient is unsure of what size she would like to be, we briefly discussed this today and she would like to be somewhere within the C/D range.   Patient had mammogram 10/21/2018 with no mammographic evidence of malignancy.  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  Not on CPAP.    Past Medical History: Allergies: No Known Allergies  Current Medications:  Current Outpatient Medications:  .  levETIRAcetam (KEPPRA) 500 MG tablet, Take 500-1,000 mg by mouth 2 (two) times daily. , Disp: , Rfl:  .  topiramate (TOPAMAX) 50 MG tablet, Take 2 tablets by mouth twice daily, Disp: , Rfl:   Past Medical Problems: Past Medical History:  Diagnosis Date  . Seizures (HCC)     Past Surgical  History: History reviewed. No pertinent surgical history.  Social History: Social History   Socioeconomic History  . Marital status: Single    Spouse name: Not on file  . Number of children: Not on file  . Years of education: Not on file  . Highest education level: Not on file  Occupational History  . Not on file  Tobacco Use  . Smoking status: Never Smoker  . Smokeless tobacco: Never Used  Vaping Use  . Vaping Use: Never used  Substance and Sexual Activity  . Alcohol use: No  . Drug use: No  . Sexual activity: Not Currently  Other Topics Concern  . Not on file  Social History Narrative  . Not on file   Social Determinants of Health   Financial Resource Strain:   . Difficulty of Paying Living Expenses: Not on file  Food Insecurity:   . Worried About Programme researcher, broadcasting/film/video in the Last Year: Not on file  . Ran Out of Food in the Last Year: Not on file  Transportation Needs:   . Lack of Transportation (Medical): Not on file  . Lack of Transportation (Non-Medical): Not on file  Physical Activity:   . Days of Exercise per Week: Not on file  . Minutes of Exercise per Session: Not on file  Stress:   . Feeling of Stress : Not  on file  Social Connections:   . Frequency of Communication with Friends and Family: Not on file  . Frequency of Social Gatherings with Friends and Family: Not on file  . Attends Religious Services: Not on file  . Active Member of Clubs or Organizations: Not on file  . Attends Banker Meetings: Not on file  . Marital Status: Not on file  Intimate Partner Violence:   . Fear of Current or Ex-Partner: Not on file  . Emotionally Abused: Not on file  . Physically Abused: Not on file  . Sexually Abused: Not on file    Family History: Family History  Problem Relation Age of Onset  . Diabetes Mother   . Hypertension Mother   . Diabetes Father     Review of Systems: Review of Systems  Constitutional: Negative.   Respiratory: Negative.    Cardiovascular: Negative.   Gastrointestinal: Negative.   Neurological: Negative.     Physical Exam: Vital Signs BP 126/87 (BP Location: Left Arm, Patient Position: Sitting, Cuff Size: Large)   Pulse 94   Temp 98.3 F (36.8 C) (Oral)   Ht 5\' 6"  (1.676 m)   Wt 229 lb 12.8 oz (104.2 kg)   LMP 06/01/2020 (Approximate)   SpO2 98%   BMI 37.09 kg/m  Physical Exam Constitutional:      General: She is not in acute distress.    Appearance: Normal appearance. She is not ill-appearing.  HENT:     Head: Normocephalic and atraumatic.  Eyes:     Pupils: Pupils are equal, round Neck:     Musculoskeletal: Normal range of motion.  Cardiovascular:     Rate and Rhythm: Normal rate and regular rhythm.     Pulses: Normal pulses.     Heart sounds: Normal heart sounds. No murmur.  Pulmonary:     Effort: Pulmonary effort is normal. No respiratory distress.     Breath sounds: Normal breath sounds. No wheezing.  Abdominal:     General: Abdomen is flat. There is no distension.     Palpations: Abdomen is soft.     Tenderness: There is no abdominal tenderness.  Musculoskeletal: Normal range of motion.  Skin:    General: Skin is warm and dry.     Findings: No erythema or rash.  Neurological:     General: No focal deficit present.     Mental Status: She is alert and oriented to person, place, and time. Mental status is at baseline.     Motor: No weakness.  Psychiatric:        Mood and Affect: Mood normal.        Behavior: Behavior normal.    Assessment/Plan: The patient is scheduled for bilateral breast reduction with Dr. 08/01/2020.  Risks, benefits, and alternatives of procedure discussed, questions answered and consent obtained.    Smoking Status: Non-smoker; Counseling Given?  N/A Last Mammogram: 10/2018; Results: Negative  Caprini Score: 3, moderate; Risk Factors include: BMI greater than 25, and length of planned surgery. Recommendation for mechanical prophylaxis during surgery.  Encourage early ambulation.   Pictures obtained:[email or web address]   Post-op Rx sent to pharmacy: Norco, Zofran, Keflex  Patient was provided with the breast reduction and General Surgical Risk consent document and Pain Medication Agreement prior to their appointment.  They had adequate time to read through the risk consent documents and Pain Medication Agreement. We also discussed them in person together during this preop appointment. All of their questions were answered to their satisfaction.  Recommended  calling if they have any further questions.  Risk consent form and Pain Medication Agreement to be scanned into patient's chart.  The risk that can be encountered with breast reduction were discussed and include the following but not limited to these:  Breast asymmetry, fluid accumulation, firmness of the breast, inability to breast feed, loss of nipple or areola, skin loss, decrease or no nipple sensation, fat necrosis of the breast tissue, bleeding, infection, healing delay.  There are risks of anesthesia, changes to skin sensation and injury to nerves or blood vessels.  The muscle can be temporarily or permanently injured.  You may have an allergic reaction to tape, suture, glue, blood products which can result in skin discoloration, swelling, pain, skin lesions, poor healing.  Any of these can lead to the need for revisonal surgery or stage procedures.  A reduction has potential to interfere with diagnostic procedures.  Nipple or breast piercing can increase risks of infection.  This procedure is best done when the breast is fully developed.  Changes in the breast will continue to occur over time.  Pregnancy can alter the outcomes of previous breast reduction surgery, weight gain and weigh loss can also effect the long term appearance.   The risks that can be encountered with and after liposuction were discussed and include the following but no limited to these:  Asymmetry, fluid accumulation, firmness of the  area, fat necrosis with death of fat tissue, bleeding, infection, delayed healing, anesthesia risks, skin sensation changes, injury to structures including nerves, blood vessels, and muscles which may be temporary or permanent, allergies to tape, suture materials and glues, blood products, topical preparations or injected agents, skin and contour irregularities, skin discoloration and swelling, deep vein thrombosis, cardiac and pulmonary complications, pain, which may persist, persistent pain, recurrence of the lesion, poor healing of the incision, possible need for revisional surgery or staged procedures. Thiere can also be persistent swelling, poor wound healing, rippling or loose skin, worsening of cellulite, swelling, and thermal burn or heat injury from ultrasound with the ultrasound-assisted lipoplasty technique. Any change in weight fluctuations can alter the outcome.      Electronically signed by: Kermit Balo Makyna Niehoff, PA-C 06/15/2020 9:05 AM

## 2020-06-27 ENCOUNTER — Other Ambulatory Visit: Payer: Self-pay

## 2020-06-27 ENCOUNTER — Encounter (HOSPITAL_BASED_OUTPATIENT_CLINIC_OR_DEPARTMENT_OTHER): Payer: Self-pay | Admitting: Plastic Surgery

## 2020-07-02 ENCOUNTER — Telehealth: Payer: Self-pay | Admitting: Surgical

## 2020-07-02 ENCOUNTER — Other Ambulatory Visit (HOSPITAL_COMMUNITY)
Admission: RE | Admit: 2020-07-02 | Discharge: 2020-07-02 | Disposition: A | Discharge status: Home / Self Care | Payer: BC Managed Care – PPO | Source: Ambulatory Visit | Attending: Plastic Surgery | Admitting: Plastic Surgery

## 2020-07-02 DIAGNOSIS — Z01812 Encounter for preprocedural laboratory examination: Secondary | ICD-10-CM | POA: Insufficient documentation

## 2020-07-02 DIAGNOSIS — Z20822 Contact with and (suspected) exposure to covid-19: Secondary | ICD-10-CM | POA: Diagnosis not present

## 2020-07-02 NOTE — Telephone Encounter (Signed)
Patient called to advise that her prescriptions had not been called in yet to the [facility] on [address]. I advised her that the nausea medicine had been called in but she said there should have been 3. Please submit other prescriptions and call patient to advise.

## 2020-07-03 ENCOUNTER — Other Ambulatory Visit: Payer: Self-pay | Admitting: Surgical

## 2020-07-03 LAB — SARS CORONAVIRUS 2 (TAT 6-24 HRS): SARS Coronavirus 2: NEGATIVE

## 2020-07-03 MED ORDER — HYDROCODONE-ACETAMINOPHEN 5-325 MG PO TABS: 1.0000 | ORAL_TABLET | Freq: Four times a day (QID) | ORAL | 0 refills | Status: AC | PRN

## 2020-07-03 MED ORDER — CEPHALEXIN 500 MG PO CAPS: 500.0000 mg | ORAL_CAPSULE | Freq: Four times a day (QID) | ORAL | 0 refills | Status: AC

## 2020-07-03 NOTE — Progress Notes (Signed)
Postop meds resent, patient did not pick up in time and they were cancelled.

## 2020-07-03 NOTE — Telephone Encounter (Signed)
Patient was informed that the prescriptions have been sent to the pharmacy.//AB/CMA

## 2020-07-03 NOTE — Telephone Encounter (Signed)
Called and spoke with the patient on (07/02/20) and informed her that the prescriptions will be resend to the pharmacy.//AB/CMA

## 2020-07-04 ENCOUNTER — Other Ambulatory Visit: Payer: Self-pay | Admitting: Surgical

## 2020-07-04 ENCOUNTER — Telehealth: Payer: Self-pay

## 2020-07-04 MED ORDER — ONDANSETRON HCL 4 MG PO TABS: 4.0000 mg | ORAL_TABLET | Freq: Three times a day (TID) | ORAL | 0 refills | Status: DC | PRN

## 2020-07-04 NOTE — Telephone Encounter (Signed)
Has been called in, it was my understanding the zofran was available. This has been fixed anyways. Thank you

## 2020-07-04 NOTE — Telephone Encounter (Signed)
Patient called to state she has received the prescriptions for [facility] but still needs the prescription for Brooke Alexander to be sent to the pharmacy for her surgery tomorrow.

## 2020-07-05 ENCOUNTER — Other Ambulatory Visit: Payer: Self-pay | Admitting: Surgical

## 2020-07-05 ENCOUNTER — Ambulatory Visit (HOSPITAL_BASED_OUTPATIENT_CLINIC_OR_DEPARTMENT_OTHER): Payer: BC Managed Care – PPO | Admitting: Anesthesiology

## 2020-07-05 ENCOUNTER — Encounter (HOSPITAL_BASED_OUTPATIENT_CLINIC_OR_DEPARTMENT_OTHER)
Admission: RE | Disposition: A | Discharge status: Home / Self Care | Payer: Self-pay | Source: Home / Self Care | Attending: Plastic Surgery

## 2020-07-05 ENCOUNTER — Encounter (HOSPITAL_BASED_OUTPATIENT_CLINIC_OR_DEPARTMENT_OTHER): Payer: Self-pay | Admitting: Plastic Surgery

## 2020-07-05 ENCOUNTER — Ambulatory Visit (HOSPITAL_BASED_OUTPATIENT_CLINIC_OR_DEPARTMENT_OTHER)
Admission: RE | Admit: 2020-07-05 | Discharge: 2020-07-05 | Disposition: A | Discharge status: Home / Self Care | Payer: BC Managed Care – PPO | Attending: Plastic Surgery | Admitting: Plastic Surgery

## 2020-07-05 ENCOUNTER — Other Ambulatory Visit: Payer: Self-pay

## 2020-07-05 DIAGNOSIS — M542 Cervicalgia: Secondary | ICD-10-CM | POA: Diagnosis not present

## 2020-07-05 DIAGNOSIS — M546 Pain in thoracic spine: Secondary | ICD-10-CM | POA: Diagnosis not present

## 2020-07-05 DIAGNOSIS — N62 Hypertrophy of breast: Secondary | ICD-10-CM | POA: Insufficient documentation

## 2020-07-05 DIAGNOSIS — G8929 Other chronic pain: Secondary | ICD-10-CM

## 2020-07-05 HISTORY — PX: BREAST REDUCTION SURGERY: SHX8

## 2020-07-05 HISTORY — DX: Sleep apnea, unspecified: G47.30

## 2020-07-05 LAB — POCT PREGNANCY, URINE: Preg Test, Ur: NEGATIVE

## 2020-07-05 SURGERY — MAMMOPLASTY, REDUCTION
Anesthesia: General | Site: Breast | Laterality: Bilateral

## 2020-07-05 MED ORDER — AMISULPRIDE (ANTIEMETIC) 5 MG/2ML IV SOLN: 10.0000 mg | Freq: Once | INTRAVENOUS | Status: DC | PRN

## 2020-07-05 MED ORDER — FENTANYL CITRATE (PF) 100 MCG/2ML IJ SOLN
INTRAMUSCULAR | Status: AC
  Filled 2020-07-05: qty 2

## 2020-07-05 MED ORDER — OXYCODONE HCL 5 MG/5ML PO SOLN: 5.0000 mg | Freq: Once | ORAL | Status: DC | PRN

## 2020-07-05 MED ORDER — SODIUM CHLORIDE 0.9% FLUSH: 3.0000 mL | INTRAVENOUS | Status: DC | PRN

## 2020-07-05 MED ORDER — SODIUM CHLORIDE 0.9% FLUSH: 3.0000 mL | Freq: Two times a day (BID) | INTRAVENOUS | Status: DC

## 2020-07-05 MED ORDER — PROPOFOL 10 MG/ML IV BOLUS
INTRAVENOUS | Status: DC | PRN
  Administered 2020-07-05: 200 mg via INTRAVENOUS

## 2020-07-05 MED ORDER — CEFAZOLIN SODIUM-DEXTROSE 2-4 GM/100ML-% IV SOLN
2.0000 g | INTRAVENOUS | Status: AC
  Administered 2020-07-05: 2 g via INTRAVENOUS

## 2020-07-05 MED ORDER — ACETAMINOPHEN 325 MG RE SUPP: 650.0000 mg | RECTAL | Status: DC | PRN

## 2020-07-05 MED ORDER — LIDOCAINE HCL (PF) 1 % IJ SOLN
INTRAMUSCULAR | Status: AC
  Filled 2020-07-05: qty 30

## 2020-07-05 MED ORDER — ACETAMINOPHEN 500 MG PO TABS
1000.0000 mg | ORAL_TABLET | Freq: Once | ORAL | Status: AC
  Administered 2020-07-05: 1000 mg via ORAL

## 2020-07-05 MED ORDER — BUPIVACAINE HCL (PF) 0.25 % IJ SOLN
INTRAMUSCULAR | Status: AC
  Filled 2020-07-05: qty 30

## 2020-07-05 MED ORDER — LIDOCAINE-EPINEPHRINE 1 %-1:100000 IJ SOLN
INTRAMUSCULAR | Status: DC | PRN
  Administered 2020-07-05: 50 mL

## 2020-07-05 MED ORDER — FENTANYL CITRATE (PF) 100 MCG/2ML IJ SOLN
INTRAMUSCULAR | Status: DC | PRN
  Administered 2020-07-05 (×4): 50 ug via INTRAVENOUS
  Administered 2020-07-05: 100 ug via INTRAVENOUS

## 2020-07-05 MED ORDER — ACETAMINOPHEN 325 MG PO TABS: 650.0000 mg | ORAL_TABLET | ORAL | Status: DC | PRN

## 2020-07-05 MED ORDER — LIDOCAINE-EPINEPHRINE 1 %-1:100000 IJ SOLN
INTRAMUSCULAR | Status: AC
  Filled 2020-07-05: qty 1

## 2020-07-05 MED ORDER — CEFAZOLIN SODIUM-DEXTROSE 2-4 GM/100ML-% IV SOLN
INTRAVENOUS | Status: AC
  Filled 2020-07-05: qty 100

## 2020-07-05 MED ORDER — ACETAMINOPHEN 500 MG PO TABS
ORAL_TABLET | ORAL | Status: AC
  Filled 2020-07-05: qty 2

## 2020-07-05 MED ORDER — SCOPOLAMINE 1 MG/3DAYS TD PT72
1.0000 | MEDICATED_PATCH | TRANSDERMAL | Status: DC
  Administered 2020-07-05: 1.5 mg via TRANSDERMAL

## 2020-07-05 MED ORDER — SODIUM CHLORIDE 0.9 % IV SOLN: 250.0000 mL | INTRAVENOUS | Status: DC | PRN

## 2020-07-05 MED ORDER — OXYCODONE HCL 5 MG PO TABS: 5.0000 mg | ORAL_TABLET | Freq: Once | ORAL | Status: DC | PRN

## 2020-07-05 MED ORDER — SUGAMMADEX SODIUM 200 MG/2ML IV SOLN
INTRAVENOUS | Status: DC | PRN
  Administered 2020-07-05: 200 mg via INTRAVENOUS

## 2020-07-05 MED ORDER — SCOPOLAMINE 1 MG/3DAYS TD PT72
MEDICATED_PATCH | TRANSDERMAL | Status: AC
  Filled 2020-07-05: qty 1

## 2020-07-05 MED ORDER — DIPHENHYDRAMINE HCL 50 MG/ML IJ SOLN
INTRAMUSCULAR | Status: DC | PRN
  Administered 2020-07-05: 6.25 mg via INTRAVENOUS

## 2020-07-05 MED ORDER — OXYCODONE HCL 5 MG PO TABS: 5.0000 mg | ORAL_TABLET | ORAL | Status: DC | PRN

## 2020-07-05 MED ORDER — LACTATED RINGERS IV SOLN: INTRAVENOUS | Status: DC

## 2020-07-05 MED ORDER — PROMETHAZINE HCL 25 MG/ML IJ SOLN: 6.2500 mg | INTRAMUSCULAR | Status: DC | PRN

## 2020-07-05 MED ORDER — LIDOCAINE HCL (CARDIAC) PF 100 MG/5ML IV SOSY
PREFILLED_SYRINGE | INTRAVENOUS | Status: DC | PRN
  Administered 2020-07-05: 50 mg via INTRAVENOUS

## 2020-07-05 MED ORDER — PHENYLEPHRINE HCL (PRESSORS) 10 MG/ML IV SOLN
INTRAVENOUS | Status: DC | PRN
  Administered 2020-07-05: 80 ug via INTRAVENOUS
  Administered 2020-07-05: 40 ug via INTRAVENOUS
  Administered 2020-07-05: 80 ug via INTRAVENOUS

## 2020-07-05 MED ORDER — EPINEPHRINE PF 1 MG/ML IJ SOLN
INTRAMUSCULAR | Status: AC
  Filled 2020-07-05: qty 1

## 2020-07-05 MED ORDER — DEXAMETHASONE SODIUM PHOSPHATE 4 MG/ML IJ SOLN
INTRAMUSCULAR | Status: DC | PRN
  Administered 2020-07-05: 10 mg via INTRAVENOUS

## 2020-07-05 MED ORDER — ROCURONIUM BROMIDE 100 MG/10ML IV SOLN
INTRAVENOUS | Status: DC | PRN
  Administered 2020-07-05: 80 mg via INTRAVENOUS

## 2020-07-05 MED ORDER — MIDAZOLAM HCL 2 MG/2ML IJ SOLN
INTRAMUSCULAR | Status: AC
  Filled 2020-07-05: qty 2

## 2020-07-05 MED ORDER — MIDAZOLAM HCL 5 MG/5ML IJ SOLN
INTRAMUSCULAR | Status: DC | PRN
  Administered 2020-07-05: 2 mg via INTRAVENOUS

## 2020-07-05 MED ORDER — CHLORHEXIDINE GLUCONATE CLOTH 2 % EX PADS: 6.0000 | MEDICATED_PAD | Freq: Once | CUTANEOUS | Status: DC

## 2020-07-05 MED ORDER — MORPHINE SULFATE (PF) 4 MG/ML IV SOLN: 2.0000 mg | INTRAVENOUS | Status: DC | PRN

## 2020-07-05 MED ORDER — FENTANYL CITRATE (PF) 100 MCG/2ML IJ SOLN: 25.0000 ug | INTRAMUSCULAR | Status: DC | PRN

## 2020-07-05 SURGICAL SUPPLY — 75 items
ADH SKN CLS APL DERMABOND .7 (GAUZE/BANDAGES/DRESSINGS) ×3
APPLIER CLIP 9.375 MED OPEN (MISCELLANEOUS) ×3
APR CLP MED 9.3 20 MLT OPN (MISCELLANEOUS) ×1
BAG DECANTER FOR FLEXI CONT (MISCELLANEOUS) ×3 IMPLANT
BINDER BREAST LRG (GAUZE/BANDAGES/DRESSINGS) IMPLANT
BINDER BREAST MEDIUM (GAUZE/BANDAGES/DRESSINGS) IMPLANT
BINDER BREAST XLRG (GAUZE/BANDAGES/DRESSINGS) IMPLANT
BINDER BREAST XXLRG (GAUZE/BANDAGES/DRESSINGS) ×3 IMPLANT
BIOPATCH RED 1 DISK 7.0 (GAUZE/BANDAGES/DRESSINGS) IMPLANT
BIOPATCH RED 1IN DISK 7.0MM (GAUZE/BANDAGES/DRESSINGS)
BLADE HEX COATED 2.75 (ELECTRODE) ×3 IMPLANT
BLADE KNIFE PERSONA 10 (BLADE) ×6 IMPLANT
BLADE SURG 15 STRL LF DISP TIS (BLADE) ×1 IMPLANT
BLADE SURG 15 STRL SS (BLADE) ×3
BNDG GAUZE ELAST 4 BULKY (GAUZE/BANDAGES/DRESSINGS) IMPLANT
CANISTER SUCT 1200ML W/VALVE (MISCELLANEOUS) ×3 IMPLANT
CLIP APPLIE 9.375 MED OPEN (MISCELLANEOUS) ×1 IMPLANT
COVER BACK TABLE 60X90IN (DRAPES) ×3 IMPLANT
COVER MAYO STAND STRL (DRAPES) ×3 IMPLANT
COVER WAND RF STERILE (DRAPES) IMPLANT
DECANTER SPIKE VIAL GLASS SM (MISCELLANEOUS) IMPLANT
DERMABOND ADVANCED (GAUZE/BANDAGES/DRESSINGS) ×6
DERMABOND ADVANCED .7 DNX12 (GAUZE/BANDAGES/DRESSINGS) ×3 IMPLANT
DRAIN CHANNEL 19F RND (DRAIN) IMPLANT
DRAPE LAPAROSCOPIC ABDOMINAL (DRAPES) ×3 IMPLANT
DRSG OPSITE POSTOP 4X12 (GAUZE/BANDAGES/DRESSINGS) ×6 IMPLANT
DRSG OPSITE POSTOP 4X6 (GAUZE/BANDAGES/DRESSINGS) IMPLANT
DRSG PAD ABDOMINAL 8X10 ST (GAUZE/BANDAGES/DRESSINGS) ×6 IMPLANT
ELECT BLADE 4.0 EZ CLEAN MEGAD (MISCELLANEOUS)
ELECT REM PT RETURN 9FT ADLT (ELECTROSURGICAL) ×3
ELECTRODE BLDE 4.0 EZ CLN MEGD (MISCELLANEOUS) IMPLANT
ELECTRODE REM PT RTRN 9FT ADLT (ELECTROSURGICAL) ×1 IMPLANT
EVACUATOR SILICONE 100CC (DRAIN) IMPLANT
GLOVE BIO SURGEON STRL SZ 6.5 (GLOVE) ×6 IMPLANT
GLOVE BIO SURGEONS STRL SZ 6.5 (GLOVE) ×3
GLOVE BIOGEL PI IND STRL 6.5 (GLOVE) ×1 IMPLANT
GLOVE BIOGEL PI INDICATOR 6.5 (GLOVE) ×2
GLOVE ECLIPSE 6.5 STRL STRAW (GLOVE) ×3 IMPLANT
GOWN STRL REUS W/ TWL LRG LVL3 (GOWN DISPOSABLE) ×4 IMPLANT
GOWN STRL REUS W/TWL LRG LVL3 (GOWN DISPOSABLE) ×12
NDL SAFETY ECLIPSE 18X1.5 (NEEDLE) IMPLANT
NEEDLE HYPO 18GX1.5 SHARP (NEEDLE)
NEEDLE HYPO 25X1 1.5 SAFETY (NEEDLE) ×3 IMPLANT
NS IRRIG 1000ML POUR BTL (IV SOLUTION) ×3 IMPLANT
PACK BASIN DAY SURGERY FS (CUSTOM PROCEDURE TRAY) ×3 IMPLANT
PAD ALCOHOL SWAB (MISCELLANEOUS) IMPLANT
PAD FOAM SILICONE BACKED (GAUZE/BANDAGES/DRESSINGS) IMPLANT
PENCIL SMOKE EVACUATOR (MISCELLANEOUS) ×3 IMPLANT
SLEEVE SCD COMPRESS KNEE MED (MISCELLANEOUS) ×3 IMPLANT
SPONGE LAP 18X18 RF (DISPOSABLE) ×12 IMPLANT
STRIP SUTURE WOUND CLOSURE 1/2 (MISCELLANEOUS) ×9 IMPLANT
SUT MNCRL AB 4-0 PS2 18 (SUTURE) ×48 IMPLANT
SUT MON AB 3-0 SH 27 (SUTURE) ×30
SUT MON AB 3-0 SH27 (SUTURE) ×10 IMPLANT
SUT MON AB 5-0 PS2 18 (SUTURE) ×6 IMPLANT
SUT PDS 3-0 CT2 (SUTURE)
SUT PDS AB 2-0 CT2 27 (SUTURE) IMPLANT
SUT PDS II 3-0 CT2 27 ABS (SUTURE) IMPLANT
SUT SILK 3 0 PS 1 (SUTURE) IMPLANT
SUT VIC AB 3-0 SH 27 (SUTURE)
SUT VIC AB 3-0 SH 27X BRD (SUTURE) IMPLANT
SUT VICRYL 4-0 PS2 18IN ABS (SUTURE) IMPLANT
SYR 3ML 23GX1 SAFETY (SYRINGE) ×3 IMPLANT
SYR 50ML LL SCALE MARK (SYRINGE) IMPLANT
SYR BULB IRRIG 60ML STRL (SYRINGE) ×3 IMPLANT
SYR CONTROL 10ML LL (SYRINGE) ×3 IMPLANT
TAPE MEASURE VINYL STERILE (MISCELLANEOUS) IMPLANT
TOWEL GREEN STERILE FF (TOWEL DISPOSABLE) ×6 IMPLANT
TRAY DSU PREP LF (CUSTOM PROCEDURE TRAY) ×3 IMPLANT
TUBE CONNECTING 20'X1/4 (TUBING) ×1
TUBE CONNECTING 20X1/4 (TUBING) ×2 IMPLANT
TUBING INFILTRATION IT-10001 (TUBING) ×3 IMPLANT
TUBING SET GRADUATE ASPIR 12FT (MISCELLANEOUS) IMPLANT
UNDERPAD 30X36 HEAVY ABSORB (UNDERPADS AND DIAPERS) ×6 IMPLANT
YANKAUER SUCT BULB TIP NO VENT (SUCTIONS) ×3 IMPLANT

## 2020-07-05 NOTE — Transfer of Care (Signed)
Immediate Anesthesia Transfer of Care Note  Patient: Brooke Alexander  Procedure(s) Performed: BILATERAL BREAST REDUCTION (Bilateral Breast)  Patient Location: PACU  Anesthesia Type:General  Level of Consciousness: awake, alert , oriented, drowsy and patient cooperative  Airway & Oxygen Therapy: Patient Spontanous Breathing and Patient connected to face mask oxygen  Post-op Assessment: Report given to RN and Post -op Vital signs reviewed and stable  Post vital signs: Reviewed and stable  Last Vitals:  Vitals Value Taken Time  BP    Temp    Pulse 106 07/05/20 1025  Resp    SpO2 100 % 07/05/20 1025  Vitals shown include unvalidated device data.  Last Pain:  Vitals:   07/05/20 0652  TempSrc: Oral  PainSc: 0-No pain      Patients Stated Pain Goal: 7 (07/05/20 5038)  Complications: No complications documented.

## 2020-07-05 NOTE — Discharge Instructions (Addendum)
Post Anesthesia Home Care Instructions  Activity: Get plenty of rest for the remainder of the day. A responsible individual must stay with you for 24 hours following the procedure.  For the next 24 hours, DO NOT: -Drive a car -Operate machinery -Drink alcoholic beverages -Take any medication unless instructed by your physician -Make any legal decisions or sign important papers.  Meals: Start with liquid foods such as gelatin or soup. Progress to regular foods as tolerated. Avoid greasy, spicy, heavy foods. If nausea and/or vomiting occur, drink only clear liquids until the nausea and/or vomiting subsides. Call your physician if vomiting continues.  Special Instructions/Symptoms: Your throat may feel dry or sore from the anesthesia or the breathing tube placed in your throat during surgery. If this causes discomfort, gargle with warm salt water. The discomfort should disappear within 24 hours.  If you had a scopolamine patch placed behind your ear for the management of post- operative nausea and/or vomiting:  1. The medication in the patch is effective for 72 hours, after which it should be removed.  Wrap patch in a tissue and discard in the trash. Wash hands thoroughly with soap and water. 2. You may remove the patch earlier than 72 hours if you experience unpleasant side effects which may include dry mouth, dizziness or visual disturbances. 3. Avoid touching the patch. Wash your hands with soap and water after contact with the patch.    INSTRUCTIONS FOR AFTER SURGERY   You will likely have some questions about what to expect following your operation.  The following information will help you and your family understand what to expect when you are discharged from the hospital.  Following these guidelines will help ensure a smooth recovery and reduce risks of complications.  Postoperative instructions include information on: diet, wound care, medications and physical activity.  AFTER  SURGERY Expect to go home after the procedure.  In some cases, you may need to spend one night in the hospital for observation.  DIET This surgery does not require a specific diet.  However, I have to mention that the healthier you eat the better your body can start healing. It is important to increasing your protein intake.  This means limiting the foods with added sugar.  Focus on fruits and vegetables and some meat. It is very important to drink water after your surgery.  If your urine is bright yellow, then it is concentrated, and you need to drink more water.  As a general rule after surgery, you should have 8 ounces of water every hour while awake.  If you find you are persistently nauseated or unable to take in liquids let us know.  NO TOBACCO USE or EXPOSURE.  This will slow your healing process and increase the risk of a wound.  WOUND CARE If you don't have a drain: You can shower the day after surgery.  Use fragrance free soap.  Dial, Dove, Ivory and Cetaphil are usually mild on the skin.  If you have steri-strips / tape directly attached to your skin leave them in place. It is OK to get these wet.  No baths, pools or hot tubs for two weeks. We close your incision to leave the smallest and best-looking scar. No ointment or creams on your incisions until given the go ahead.  Especially not Neosporin (Too many skin reactions with this one).  A few weeks after surgery you can use Mederma and start massaging the scar. We ask you to wear your binder or   sports bra for the first 6 weeks around the clock, including while sleeping. This provides added comfort and helps reduce the fluid accumulation at the surgery site.  ACTIVITY No heavy lifting until cleared by the doctor.  It is OK to walk and climb stairs. In fact, moving your legs is very important to decrease your risk of a blood clot.  It will also help keep you from getting deconditioned.  Every 1 to 2 hours get up and walk for 5 minutes. This  will help with a quicker recovery back to normal.  Let pain be your guide so you don't do too much.  NO, you cannot do the spring cleaning and don't plan on taking care of anyone else.  This is your time for TLC.   WORK Everyone returns to work at different times. As a rough guide, most people take at least 1 - 2 weeks off prior to returning to work. If you need documentation for your job, bring the forms to your postoperative follow up visit.  DRIVING Arrange for someone to bring you home from the hospital.  You may be able to drive a few days after surgery but not while taking any narcotics or valium.  BOWEL MOVEMENTS Constipation can occur after anesthesia and while taking pain medication.  It is important to stay ahead for your comfort.  We recommend taking Milk of Magnesia (2 tablespoons; twice a day) while taking the pain pills.  SEROMA This is fluid your body tried to put in the surgical site.  This is normal but if it creates excessive pain and swelling let us know.  It usually decreases in a few weeks.  MEDICATIONS and PAIN CONTROL At your preoperative visit for you history and physical you were given the following medications: 1. An antibiotic: Start this medication when you get home and take according to the instructions on the bottle. 2. Zofran 4 mg:  This is to treat nausea and vomiting.  You can take this every 6 hours as needed and only if needed. 3. Norco (hydrocodone/acetaminophen) 5/325 mg:  This is only to be used after you have taken the motrin or the tylenol. Every 8 hours as needed. Over the counter Medication to take: 4. Ibuprofen (Motrin) 600 mg:  Take this every 6 hours.  If you have additional pain then take 500 mg of the tylenol.  Only take the Norco after you have tried these two. 5. Miralax or stool softener of choice: Take this according to the bottle if you take the Norco.  WHEN TO CALL Call your surgeon's office if any of the following occur: . Fever 101  degrees F or greater . Excessive bleeding or fluid from the incision site. . Pain that increases over time without aid from the medications . Redness, warmth, or pus draining from incision sites . Persistent nausea or inability to take in liquids . Severe misshapen area that underwent the operation.   

## 2020-07-05 NOTE — Anesthesia Postprocedure Evaluation (Signed)
Anesthesia Post Note  Patient: Brooke Alexander  Procedure(s) Performed: BILATERAL BREAST REDUCTION (Bilateral Breast)     Patient location during evaluation: PACU Anesthesia Type: General Level of consciousness: awake Pain management: pain level controlled Vital Signs Assessment: post-procedure vital signs reviewed and stable Respiratory status: spontaneous breathing and respiratory function stable Cardiovascular status: stable Postop Assessment: no apparent nausea or vomiting Anesthetic complications: no   No complications documented.  Last Vitals:  Vitals:   07/05/20 1105 07/05/20 1115  BP:  128/70  Pulse: (!) 108 (!) 118  Resp:  16  Temp:  37.4 C  SpO2: 98% 99%    Last Pain:  Vitals:   07/05/20 1115  TempSrc:   PainSc: 0-No pain                 Mellody Dance

## 2020-07-05 NOTE — Anesthesia Procedure Notes (Signed)

## 2020-07-05 NOTE — Interval H&P Note (Signed)
History and Physical Interval Note:  07/05/2020 6:56 AM  Brooke Alexander  has presented today for surgery, with the diagnosis of mammary hypertrophy.  The various methods of treatment have been discussed with the patient and family. After consideration of risks, benefits and other options for treatment, the patient has consented to  Procedure(s) with comments: BREAST REDUCTION WITH LIPOSUCTION (Bilateral) - 3.5 hours as a surgical intervention.  The patient's history has been reviewed, patient examined, no change in status, stable for surgery.  I have reviewed the patient's chart and labs.  Questions were answered to the patient's satisfaction.     Alena Bills Devrin Monforte

## 2020-07-05 NOTE — Anesthesia Preprocedure Evaluation (Addendum)
Anesthesia Evaluation  Patient identified by MRN, date of birth, ID band Patient awake    Reviewed: Allergy & Precautions, NPO status , Patient's Chart, lab work & pertinent test results  Airway Mallampati: II  TM Distance: >3 FB Neck ROM: Full    Dental   Braces:   Pulmonary sleep apnea ,    Pulmonary exam normal breath sounds clear to auscultation       Cardiovascular Exercise Tolerance: Good negative cardio ROS Normal cardiovascular exam Rhythm:Regular Rate:Normal     Neuro/Psych Seizures - (on Keppra),  negative psych ROS   GI/Hepatic negative GI ROS, Neg liver ROS,   Endo/Other  Morbid obesity  Renal/GU negative Renal ROS     Musculoskeletal negative musculoskeletal ROS (+)   Abdominal (+) + obese,   Peds negative pediatric ROS (+)  Hematology negative hematology ROS (+)   Anesthesia Other Findings   Reproductive/Obstetrics negative OB ROS                            Anesthesia Physical Anesthesia Plan  ASA: III  Anesthesia Plan: General   Post-op Pain Management:    Induction:   PONV Risk Score and Plan: 3  Airway Management Planned: Oral ETT  Additional Equipment:   Intra-op Plan:   Post-operative Plan: Extubation in OR  Informed Consent: I have reviewed the patients History and Physical, chart, labs and discussed the procedure including the risks, benefits and alternatives for the proposed anesthesia with the patient or authorized representative who has indicated his/her understanding and acceptance.       Plan Discussed with: CRNA and Anesthesiologist  Anesthesia Plan Comments:        Anesthesia Quick Evaluation

## 2020-07-05 NOTE — Op Note (Signed)
Breast Reduction Op note:    DATE OF PROCEDURE: 07/05/2020  LOCATION: [facility]  SURGEON: Alan Ripper Sanger Christipher Rieger, DO  ASSISTANT: Keenan Bachelor, PA  PREOPERATIVE DIAGNOSIS 1. Macromastia 2. Neck Pain 3. Back Pain  POSTOPERATIVE DIAGNOSIS 1. Macromastia 2. Neck Pain 3. Back Pain  PROCEDURES 1. Bilateral breast reduction.  Right reduction 1212 g, Left reduction 1460 g  COMPLICATIONS: None.  DRAINS: none  INDICATIONS FOR PROCEDURE Brooke Alexander is a 34 y.o. year-old female born on Mar 02, 1986,with a history of symptomatic macromastia with concominant back pain, neck pain, shoulder grooving from her bra.   MRN: [id number]  CONSENT Informed consent was obtained directly from the patient. The risks, benefits and alternatives were fully discussed. Specific risks including but not limited to bleeding, infection, hematoma, seroma, scarring, pain, nipple necrosis, asymmetry, poor cosmetic results, and need for further surgery were discussed. The patient had ample opportunity to have her questions answered to her satisfaction.  DESCRIPTION OF PROCEDURE  Patient was brought into the operating room and placed in a supine position.  SCDs were placed and appropriate padding was performed.  Antibiotics were given. The patient underwent general anesthesia and the chest was prepped and draped in a sterile fashion.  A timeout was performed and all information was confirmed to be correct.  Right side: Preoperative markings were confirmed.  Incision lines were injected with 1% Xylocaine with epinephrine.  After waiting for vasoconstriction, the marked lines were incised.  A Wise-pattern superomedial breast reduction was performed by de-epithelializing the pedicle, using bovie to create the superomedial pedicle, and removing breast tissue from the lateral and inferior portions of the breast.  Care was taken to not undermine the breast pedicle. Hemostasis was achieved.  The  nipple was gently rotated into position and the soft tissue closed with 4-0 Monocryl.   The pocket was irrigated and hemostasis confirmed.  The deep tissues were approximated with 3-0 Monocryl sutures and the skin was closed with deep dermal and subcuticular 4-0 Monocryl sutures.  The nipple and skin flaps had good capillary refill at the end of the procedure.    Left side: Preoperative markings were confirmed.  Incision lines were injected with 1% Xylocaine with epinephrine.  After waiting for vasoconstriction, the marked lines were incised.  A Wise-pattern superomedial breast reduction was performed by de-epithelializing the pedicle, using bovie to create the superomedial pedicle, and removing breast tissue from the lateral and inferior portions of the breast.  Care was taken to not undermine the breast pedicle. Hemostasis was achieved.  The nipple was gently rotated into position and the soft tissue was closed with 4-0 Monocryl.  The patient was sat upright and size and shape symmetry was confirmed.  The pocket was irrigated and hemostasis confirmed.  The deep tissues were approximated with 3-0 Monocryl sutures and the skin was closed with deep dermal and subcuticular 4-0 Monocryl sutures.  Dermabond was applied.  A breast binder and ABDs were placed.  The nipple and skin flaps had good capillary refill at the end of the procedure.  The patient tolerated the procedure well. The patient was allowed to wake from anesthesia and taken to the recovery room in satisfactory condition.  The advanced practice practitioner (APP) assisted throughout the case.  The APP was essential in retraction and counter traction when needed to make the case progress smoothly.  This retraction and assistance made it possible to see the tissue plans for the procedure.  The assistance was needed for blood control,  tissue re-approximation and assisted with closure of the incision site.

## 2020-07-06 ENCOUNTER — Encounter (HOSPITAL_BASED_OUTPATIENT_CLINIC_OR_DEPARTMENT_OTHER): Payer: Self-pay | Admitting: Plastic Surgery

## 2020-07-09 ENCOUNTER — Telehealth: Payer: Self-pay

## 2020-07-09 LAB — SURGICAL PATHOLOGY

## 2020-07-09 NOTE — Telephone Encounter (Signed)
Patient called regarding questions about showering.  She would like to know if she is supposed to take the gauze over her nipple off to shower and then put it back on.  Please call.

## 2020-07-10 ENCOUNTER — Telehealth: Payer: Self-pay

## 2020-07-10 NOTE — Telephone Encounter (Signed)
Returned pt's call regarding : She was going to take a shower & wanted to know if she can remove the gauze pad from her nipple/areola. She indicated that it was "white/gauze" I confirmed with her that she can remove it to shower & then replace with clean/dry dressing after.  She confirms that she continues to wear the binder & denies any drainage/chills/fever & no n/v.   she is eating/drinking & has normal bowel/bladder function. She states she has no pain either breast and she feels they are equal in size. Pt is reminded to call for any change/concerns & she will keep her f/u appointment with Dr. Ulice Bold on Fri. 07/13/20

## 2020-07-12 ENCOUNTER — Encounter: Payer: Self-pay | Admitting: Surgical

## 2020-07-12 NOTE — Progress Notes (Signed)
Patient is a 34 year old female here for follow-up after bilateral breast reduction with Dr. Ulice Bold on 07/05/2020. Patient had 1212 g removed from the right breast and 1460 g removed from the left breast.  Patient reports she is doing well today, she reports some tenderness but is otherwise doing well. No infectious symptoms.  Chaperone present on exam On exam bilateral breasts are soft, bilateral breasts are symmetric, bilateral NAC's are viable with good color. No necrosis noted. Honeycomb and Steri-Strip dressings in place. No incisional dehiscence noted. No foul odor is noted. No erythema noted. No drainage noted.  Recommend continue her compressive garment. She can switch to a sports bra if she would like, however it needs to provide support and compression. Recommend avoiding strenuous activity. Recommend following up in 1 week for reevaluation. Call with questions or concerns.

## 2020-07-13 ENCOUNTER — Encounter: Payer: Self-pay | Admitting: Surgical

## 2020-07-13 ENCOUNTER — Ambulatory Visit (INDEPENDENT_AMBULATORY_CARE_PROVIDER_SITE_OTHER): Payer: BC Managed Care – PPO | Admitting: Surgical

## 2020-07-13 ENCOUNTER — Other Ambulatory Visit: Payer: Self-pay

## 2020-07-13 VITALS — BP 109/71 | HR 92 | Temp 98.8°F

## 2020-07-13 DIAGNOSIS — Z9889 Other specified postprocedural states: Secondary | ICD-10-CM

## 2020-07-24 ENCOUNTER — Encounter: Payer: Self-pay | Admitting: Surgical

## 2020-07-24 ENCOUNTER — Ambulatory Visit (INDEPENDENT_AMBULATORY_CARE_PROVIDER_SITE_OTHER): Payer: BC Managed Care – PPO | Admitting: Surgical

## 2020-07-24 ENCOUNTER — Other Ambulatory Visit: Payer: Self-pay

## 2020-07-24 VITALS — BP 111/70 | HR 87 | Temp 98.8°F

## 2020-07-24 DIAGNOSIS — Z9889 Other specified postprocedural states: Secondary | ICD-10-CM

## 2020-07-24 NOTE — Progress Notes (Signed)
Patient is a 34 year old female here for follow-up after bilateral breast reduction with Dr. Ulice Bold on 07/05/2020.  Patient reports she is overall feeling well, she does have some tenderness of bilateral breasts along the inframammary fold.  She still has the honeycomb dressing and Steri-Strips in place.  She has been wearing the breast binder still, reports that the sports bra caused her to have some pain within the inframammary fold.  She is still out of work due to the tenderness/pain.  Chaperone present on exam On exam bilateral NAC's are viable with good color, bilateral breast incisions intact.  Honeycomb dressings and Steri-Strips in place.  After removal of honeycomb dressings, no incisional dehiscence was noted.  There is no erythema or cellulitic changes of either breast.  I was able to aspirate 370 cc of dark serosanguineous fluid from the right breast using a sterile technique, patient tolerated this fine.  I recommend she continue to wear the compressive garment, recommend she switch to a more compressive sports bra if she is able, however she is having some pain when wearing sports bra due to the band along the inframammary fold.  I do not see any sign of infection.  Recommend avoiding any strenuous activity.  Patient can return to work as able, but she does have lifting restrictions until 08/20/2020.  Call with questions or concerns, would like to see patient back in 2 weeks.

## 2020-08-09 NOTE — Progress Notes (Signed)
Patient is a 34 year old female here for follow-up after bilateral breast reduction with Dr. Ulice Bold on 07/05/2020.  At her last visit I was able to drain 370 cc of serosanguineous fluid from the right breast.  Patient reports that today she feels as if she has a little bit of increased swelling in her right breast and left breast within the axillary region.  She reports that she is otherwise feeling well.  No fevers, chills, shortness of breath or chest pain.  She is still wearing a sports bra.  Chaperone on exam On exam right breast with positive fluid wave.  Left breast soft, no positive fluid wave noted.  Bilateral NAC's are viable with good color.  No necrosis noted.  Bilateral breast incisions intact.  Right breast larger than left breast due to swelling.  Sports bra is not providing much compression on evaluation.  I was able to aspirate 150 cc from the right breast using a sterile technique.  Patient tolerated this fine.  I recommend the patient continue to use compressive garment 24/7, she is wearing a sports bra currently, but the sports bra she is wearing is not very supportive and is quite loose on her breasts.  We previously discussed purchasing or using a more compressive garment.  Today in the office we wrapped her bilateral breasts with an Ace wrap to assist with the swelling of the right breast.  There is no sign of infection.  I recommend following up in 2 to 3 weeks for reevaluation.  Patient can return to work with no lifting restrictions in 1 week on 08/16/2020.  Restrictions are mostly limited by pain.  We will complete the necessary forms for her today and fax them.

## 2020-08-10 ENCOUNTER — Encounter: Payer: Self-pay | Admitting: Surgical

## 2020-08-10 ENCOUNTER — Other Ambulatory Visit: Payer: Self-pay

## 2020-08-10 ENCOUNTER — Ambulatory Visit (INDEPENDENT_AMBULATORY_CARE_PROVIDER_SITE_OTHER): Payer: BC Managed Care – PPO | Admitting: Surgical

## 2020-08-10 VITALS — BP 109/72 | HR 102 | Temp 98.4°F

## 2020-08-10 DIAGNOSIS — Z9889 Other specified postprocedural states: Secondary | ICD-10-CM

## 2020-08-21 ENCOUNTER — Other Ambulatory Visit: Payer: Self-pay

## 2020-08-21 ENCOUNTER — Encounter: Payer: Self-pay | Admitting: Surgical

## 2020-08-21 ENCOUNTER — Ambulatory Visit (INDEPENDENT_AMBULATORY_CARE_PROVIDER_SITE_OTHER): Payer: BC Managed Care – PPO | Admitting: Surgical

## 2020-08-21 VITALS — BP 142/86 | HR 91 | Temp 97.8°F

## 2020-08-21 DIAGNOSIS — Z9889 Other specified postprocedural states: Secondary | ICD-10-CM

## 2020-08-21 NOTE — Progress Notes (Signed)
Patient is a 34 year old female here for follow-up to bilateral breast reduction with Dr. Ulice Bold on 07/05/2020.  At her last visit on 08/10/2020 aspirated 150 cc in the right breast using a sterile technique.  I previously discussed with the patient that a more supportive and compressive sports bra would be necessary to decrease any additional fluid accumulation.  Today she reports that she is doing well.  She has noticed a few areas of firmness along the inside of each breast as well as some tenderness on the lateral breast.  She is otherwise doing well.  She feels that the swelling has improved, however does feel as if the right side is still more swollen than the left.  Chaperone present on exam On exam bilateral breast incisions intact.  Bilateral NAC's are viable with good color.  No wounds noted.  She does have some scarring along the right and left lateral breast incisions as well as the medial aspect of each breast.  He feels as if it is some fat necrosis/scarring.  Right breast is larger than the left breast, there is some fluid accumulation contributing to this.  She has a slight positive fluid wave on exam of the right breast.  We discussed needle aspiration versus allowing the fluid to resolve on its own.  We discussed using an Ace wrap as needed for support/compression.  We discussed purchasing a more supportive sports bra as the sports bra she has is not providing as much support as I believe it should.  There is no sign of infection. In regards to restrictions, she is able to return to normal activities.  She reports that she is returning to work today.  We discussed scheduling a follow-up in 2 months to reevaluate.  I discussed with the patient that if she has any changes prior to then she can call and we would be happy to see her sooner.  If she is doing well at that point she can always call to cancel.  Pictures were obtained of the patient and placed in the chart with the patient's  or guardian's permission.

## 2020-09-08 ENCOUNTER — Encounter (HOSPITAL_COMMUNITY): Payer: Self-pay | Admitting: *Deleted

## 2020-09-08 ENCOUNTER — Emergency Department (HOSPITAL_COMMUNITY)
Admission: EM | Admit: 2020-09-08 | Discharge: 2020-09-08 | Disposition: A | Discharge status: Home / Self Care | Payer: BC Managed Care – PPO | Attending: Emergency Medicine | Admitting: Emergency Medicine

## 2020-09-08 ENCOUNTER — Other Ambulatory Visit: Payer: Self-pay

## 2020-09-08 DIAGNOSIS — U071 COVID-19: Secondary | ICD-10-CM | POA: Diagnosis not present

## 2020-09-08 DIAGNOSIS — Z20822 Contact with and (suspected) exposure to covid-19: Secondary | ICD-10-CM

## 2020-09-08 DIAGNOSIS — J029 Acute pharyngitis, unspecified: Secondary | ICD-10-CM | POA: Diagnosis present

## 2020-09-08 NOTE — ED Notes (Signed)
Pt discharged from this ED in stable condition at this time. All discharge instructions and follow up care reviewed with pt with no further questions at this time. Pt ambulatory with steady gait, clear speech.  

## 2020-09-08 NOTE — ED Provider Notes (Signed)
[facility]-EMERGENCY DEPT Provider Note   CSN: [id number] Arrival date & time: 09/08/20  1101     History Chief Complaint  Patient presents with  . Generalized Body Aches  . Sore Throat  . Chills    Brooke Alexander is a 35 y.o. female who presents for evaluation of 4 days of generalized body aches, chills, headache, sore throat, nasal congestion, rhinorrhea, cough.  She reports that cough is productive of phlegm.  She reports that she has still been able tolerate her secretions and p.o.  She has not really taken any medication for her symptoms.  She has not measured any fever but is sometimes felt hot and cold.  She does not know of any sick exposure or COVID exposure.  She has not been vaccinated for COVID-19.  She denies any history of COPD or asthma.  The history is provided by the patient.       Past Medical History:  Diagnosis Date  . Seizures (HCC)    "last seizure 2015"  . Sleep apnea     Patient Active Problem List   Diagnosis Date Noted  . Neck pain 03/06/2020  . 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 Seizure (HCC) 12/31/2019  . Back pain 07/28/2017  . Morbid obesity (HCC) 07/28/2017  . OSA (obstructive sleep apnea) 07/28/2017    Past Surgical History:  Procedure Laterality Date  . BREAST REDUCTION SURGERY Bilateral 07/05/2020   Procedure: BILATERAL BREAST REDUCTION;  Surgeon: Peggye Form, DO;  Location: [facility];  Service: Plastics;  Laterality: Bilateral;  3.5 hours  . NO PAST SURGERIES       OB History   No obstetric history on file.     Family History  Problem Relation Age of Onset  . Diabetes Mother   . Hypertension Mother   . Diabetes Father     Social History   Tobacco Use  . Smoking status: Never Smoker  . Smokeless tobacco: Never Used  Vaping Use  . Vaping Use: Never used  Substance Use Topics  . Alcohol use: No  . Drug use: No    Home Medications Prior to Admission medications    Medication Sig Start Date End Date Taking? Authorizing Provider  levETIRAcetam (KEPPRA) 500 MG tablet Take 500-1,000 mg by mouth 2 (two) times daily.  09/28/19   [provider]  topiramate (TOPAMAX) 50 MG tablet Take 2 tablets by mouth twice daily 12/22/19   [provider]    Allergies    Patient has no known allergies.  Review of Systems   Review of Systems  Constitutional: Positive for chills and fever.  HENT: Positive for congestion, rhinorrhea and sore throat. Negative for trouble swallowing.   Respiratory: Positive for cough. Negative for shortness of breath.   Cardiovascular: Negative for chest pain.  Gastrointestinal: Negative for nausea and vomiting.  All other systems reviewed and are negative.   Physical Exam Updated Vital Signs BP 115/85   Pulse 87   Temp 98.4 F (36.9 C) (Oral)   Resp 18   Ht 5\' 6"  (1.676 m)   Wt 108.4 kg   SpO2 99%   BMI 38.58 kg/m   Physical Exam Vitals and nursing note reviewed.  Constitutional:      Appearance: She is well-developed and well-nourished.  HENT:     Head: Normocephalic and atraumatic.     Mouth/Throat:     Comments: Posterior pharynx is erythematous.  No edema, exudates.  Uvula is midline.  Airway is patent, phonation is intact. Eyes:     General: No scleral icterus.       Right eye: No discharge.        Left eye: No discharge.     Extraocular Movements: EOM normal.     Conjunctiva/sclera: Conjunctivae normal.  Pulmonary:     Effort: Pulmonary effort is normal.     Comments: Lungs clear to auscultation bilaterally.  Symmetric chest rise.  No wheezing, rales, rhonchi. Skin:    General: Skin is warm and dry.  Neurological:     Mental Status: She is alert.  Psychiatric:        Mood and Affect: Mood and affect normal.        Speech: Speech normal.        Behavior: Behavior normal.     ED Results / Procedures / Treatments   Labs (all labs ordered are listed, but only abnormal results are  displayed) Labs Reviewed  SARS CORONAVIRUS 2 (TAT 6-24 HRS)    EKG None  Radiology No results found.  Procedures Procedures (including critical care time)  Medications Ordered in ED Medications - No data to display  ED Course  I have reviewed the triage vital signs and the nursing notes.  Pertinent labs & imaging results that were available during my care of the patient were reviewed by me and considered in my medical decision making (see chart for details).    MDM Rules/Calculators/A&P                          34 year old female who presents for evaluation of rhinorrhea, nasal congestion, generalized body aches, subjective fever/chills x4 days.  She has not been vaccinated for COVID.  No known COVID exposure that she knows of.  On initial arrival, she is afebrile, nontoxic-appearing.  Vital signs are stable.  On exam, she is well-appearing.  Exam not concerning for peritonsillar abscess, Ludwig angina.  Lung exam benign.  Suspect viral illness versus COVID-19.  Patient has had a COVID-19 test ordered.  I instructed patient that this is pending.  Encouraged at home supportive care measures. At this time, patient exhibits no emergent life-threatening condition that require further evaluation in ED. Patient had ample opportunity for questions and discussion. All patient's questions were answered with full understanding. Strict return precautions discussed. Patient expresses understanding and agreement to plan.   Brooke Alexander was evaluated in Emergency Department on 09/08/2020 for the symptoms described in the history of present illness. She was evaluated in the context of the global COVID-19 pandemic, which necessitated consideration that the patient might be at risk for infection with the SARS-CoV-2 virus that causes COVID-19. Institutional protocols and algorithms that pertain to the evaluation of patients at risk for COVID-19 are in a state of rapid change based on information released by  regulatory bodies including the CDC and federal and state organizations. These policies and algorithms were followed during the patient's care in the ED.  Portions of this note were generated with Scientist, clinical (histocompatibility and immunogenetics). Dictation errors may occur despite best attempts at proofreading.  Final Clinical Impression(s) / ED Diagnoses Final diagnoses:  Suspected COVID-19 virus infection    Rx / DC Orders ED Discharge Orders    None       Rosana Hoes 09/08/20 1617    Gerhard Munch, MD 09/08/20 [phone]

## 2020-09-08 NOTE — ED Triage Notes (Signed)
Since the 5th body aches, chills, headache, sore throat

## 2020-09-08 NOTE — Discharge Instructions (Signed)
You have a COVID test pending. This may take several hours to come back. You can check online or use the MyChart App to look at your results.   You can take Tylenol or Ibuprofen as directed for pain. You can alternate Tylenol and Ibuprofen every 4 hours. If you take Tylenol at 1pm, then you can take Ibuprofen at 5pm. Then you can take Tylenol again at 9pm.   Make sure you are staying hydrated and drinking plenty of fluids.   Return to the Emergency Dept for any difficulty breathing, vomiting/inability to keep any food or liquid downs, chest pain or any other worsening or concerning symptoms.  

## 2020-09-09 LAB — SARS CORONAVIRUS 2 (TAT 6-24 HRS): SARS Coronavirus 2: POSITIVE — AB

## 2020-10-23 ENCOUNTER — Ambulatory Visit: Payer: BC Managed Care – PPO | Admitting: Plastic Surgery

## 2021-05-27 DIAGNOSIS — U071 COVID-19: Secondary | ICD-10-CM | POA: Diagnosis not present

## 2021-05-31 ENCOUNTER — Ambulatory Visit (INDEPENDENT_AMBULATORY_CARE_PROVIDER_SITE_OTHER)
Admission: RE | Admit: 2021-05-31 | Discharge: 2021-05-31 | Disposition: A | Discharge status: Home / Self Care | Payer: BC Managed Care – PPO | Source: Ambulatory Visit | Attending: Family Medicine | Admitting: Family Medicine

## 2021-05-31 ENCOUNTER — Telehealth: Payer: BC Managed Care – PPO | Admitting: Family Medicine

## 2021-05-31 ENCOUNTER — Other Ambulatory Visit: Payer: Self-pay

## 2021-05-31 ENCOUNTER — Encounter: Payer: Self-pay | Admitting: Family Medicine

## 2021-05-31 VITALS — Ht 66.0 in

## 2021-05-31 DIAGNOSIS — R059 Cough, unspecified: Secondary | ICD-10-CM | POA: Diagnosis not present

## 2021-05-31 DIAGNOSIS — R0600 Dyspnea, unspecified: Secondary | ICD-10-CM | POA: Diagnosis not present

## 2021-05-31 DIAGNOSIS — U071 COVID-19: Secondary | ICD-10-CM | POA: Diagnosis not present

## 2021-05-31 MED ORDER — ALBUTEROL SULFATE HFA 108 (90 BASE) MCG/ACT IN AERS: 2.0000 | INHALATION_SPRAY | Freq: Four times a day (QID) | RESPIRATORY_TRACT | 0 refills | Status: DC | PRN

## 2021-05-31 MED ORDER — BENZONATATE 100 MG PO CAPS: 200.0000 mg | ORAL_CAPSULE | Freq: Two times a day (BID) | ORAL | 0 refills | Status: AC | PRN

## 2021-05-31 NOTE — Progress Notes (Signed)
Virtual Visit via Video Note I connected with Brooke Alexander on 05/31/21 by a video enabled telemedicine application and verified that I am speaking with the correct person using two identifiers.  Location patient: home Location provider:Home office Persons participating in the virtual visit: patient, provider  I discussed the limitations of evaluation and management by telemedicine and the availability of in person appointments. The patient expressed understanding and agreed to proceed.  Chief Complaint  Patient presents with   Follow-up   HPI: [name] is a 35 yo female with hx of OSA,seizure disorder,and BMI 38 c/o respiratory symptoms that started about 6 days ago. Diagnosed with COVID-19 infection 3 days ago. She was seen in a acute care facility, [place], on 05/28/21.  COVID 19 test negative, she was given a prescription for a Z-Pak.She was called next day to let her know that PCR COVID test done in saliva sample was positive. She was not given Rx for antiviral medication.  She is concerned because she is having a "bad" cough, clear sputum, yesterday a few specks of blood mixed with mucus. Difficulty breathing when she is in bed at night, attributed to cough, no limitations in daily activities during the day.  She denies fever, chills, body aches, nasal congestion, rhinorrhea, anosmia,ageusia,CP, wheezing, exertional dyspnea, abdominal pain, changes in bowel habits, urinary symptoms, or a skin rash. She had nausea and vomiting after coughing spell. + Sore throat, improved.  In general she is feeling better. She is planning on traveling overseas in a week, [place].  ROS: See pertinent positives and negatives per HPI.  Past Medical History:  Diagnosis Date   Seizures (HCC)    "last seizure 2015"   Sleep apnea    Past Surgical History:  Procedure Laterality Date   BREAST REDUCTION SURGERY Bilateral 07/05/2020   Procedure: BILATERAL BREAST REDUCTION;  Surgeon: Peggye Form, DO;  Location: [facility];  Service: Plastics;  Laterality: Bilateral;  3.5 hours   NO PAST SURGERIES      Family History  Problem Relation Age of Onset   Diabetes Mother    Hypertension Mother    Diabetes Father     Social History   Socioeconomic History   Marital status: Single    Spouse name: Not on file   Number of children: Not on file   Years of education: Not on file   Highest education level: Not on file  Occupational History   Not on file  Tobacco Use   Smoking status: Never   Smokeless tobacco: Never  Vaping Use   Vaping Use: Never used  Substance and Sexual Activity   Alcohol use: No   Drug use: No   Sexual activity: Not Currently  Other Topics Concern   Not on file  Social History Narrative   Not on file   Social Determinants of Health   Financial Resource Strain: Not on file  Food Insecurity: Not on file  Transportation Needs: Not on file  Physical Activity: Not on file  Stress: Not on file  Social Connections: Not on file  Intimate Partner Violence: Not on file   Current Outpatient Medications:    levETIRAcetam (KEPPRA) 500 MG tablet, Take 500-1,000 mg by mouth 2 (two) times daily. , Disp: , Rfl:    topiramate (TOPAMAX) 50 MG tablet, Take 2 tablets by mouth twice daily, Disp: , Rfl:   EXAM:  VITALS per patient if applicable:Ht 5\' 6"  (1.676 m)   LMP 05/12/2021   BMI 38.58  kg/m   GENERAL: alert, oriented, appears well and in no acute distress  HEENT: atraumatic, conjunctiva clear, no obvious abnormalities on inspection of external nose and ears  NECK: normal movements of the head and neck  LUNGS: on inspection no signs of respiratory distress, breathing rate appears normal, no obvious gross SOB, gasping or wheezing. No cough during her visit.  CV: no obvious cyanosis  MS: moves all visible extremities without noticeable abnormality  PSYCH/NEURO: pleasant and cooperative, no obvious depression or anxiety, speech and  thought processing grossly intact  ASSESSMENT AND PLAN:  Discussed the following assessment and plan:  COVID-19 virus infection - Plan: albuterol (VENTOLIN HFA) 108 (90 Base) MCG/ACT inhaler I do not have records of visit at [facility] acute care nor COVID 19 test. It has been 6 days already since symptoms onset, so no benefit from antiviral benefit at this time. We discussed possible complications of COVID 19 infection. Symptomatic treatment recommended at this time. Clearly instructed about warning signs.  I do not see a contraindication for her to travel overseas in a week as far as symptoms are greatly improved.  I offered to give her a letter to cancel her trip if needed, she will let me know.  Cough - Plan: DG Chest 2 View, benzonatate (TESSALON) 100 MG capsule Explained that cough can last a few more days and even weeks after acute symptoms have resolved. Chest x-ray order placed, she will go today. Benzonatate for symptomatic treatment. Adequate hydration.  I discussed the assessment and treatment plan with the patient. The patient was provided an opportunity to ask questions and all were answered. The patient agreed with the plan and demonstrated an understanding of the instructions.  Return if symptoms worsen or fail to improve.  Loryn Haacke [place], MD

## 2022-03-05 NOTE — Progress Notes (Unsigned)
HPI: [name] is a 36 y.o. female, who is here today for her routine physical.  Last CPE: 12/30/19  She is not exercising regularly, she walks "every now and then." Following a healthful diet: Not consistently for the past few months.  She changes from 1st to 3rd shift every 2 days. Days she work during the day she does from 5 am to 2 pm.  Chronic medical problems: Generalized epilepsy and sleep apnea among some. She follows with neurology annually. She takes Topamax and Keppra.  Immunization History  Administered Date(s) Administered   Tdap 03/21/2017   Health Maintenance  Topic Date Due   Hepatitis C Screening  Never done   PAP SMEAR-Modifier  Never done   COVID-19 Vaccine (1) 03/23/2022 (Originally 11/12/1987)   HIV Screening  07/16/2022 (Originally 05/14/2002)   TETANUS/TDAP  07/17/2027 (Originally 03/22/2027)   INFLUENZA VACCINE  04/01/2022   HPV VACCINES  Aged Out   Dyslipidemia: She is not on pharmacologic treatment. Lab Results  Component Value Date   CHOL 178 12/30/2019   HDL 36.90 (L) 12/30/2019   LDLCALC 129 (H) 12/30/2019   TRIG 62.0 12/30/2019   CHOLHDL 5 12/30/2019   She is inquiring about appetite suppressor to help with wt loss. OSA: She did not tolerate CPAP.  Review of Systems  Constitutional:  Negative for appetite change and fever.  HENT:  Negative for hearing loss, mouth sores, sore throat, trouble swallowing and voice change.   Eyes:  Negative for redness and visual disturbance.  Respiratory:  Negative for cough, shortness of breath and wheezing.   Cardiovascular:  Negative for chest pain and leg swelling.  Gastrointestinal:  Negative for abdominal pain, nausea and vomiting.       No changes in bowel habits.  Endocrine: Negative for cold intolerance, heat intolerance, polydipsia, polyphagia and polyuria.  Genitourinary:  Negative for decreased urine volume, dysuria, hematuria, vaginal bleeding and vaginal discharge.  Musculoskeletal:   Negative for gait problem and myalgias.  Skin:  Negative for color change and rash.  Allergic/Immunologic: Negative for environmental allergies.  Neurological:  Negative for syncope, weakness and headaches.  Hematological:  Negative for adenopathy. Does not bruise/bleed easily.  Psychiatric/Behavioral:  Negative for confusion and sleep disturbance.   All other systems reviewed and are negative.  Current Outpatient Medications on File Prior to Visit  Medication Sig Dispense Refill   levETIRAcetam (KEPPRA) 500 MG tablet Take 500-1,000 mg by mouth 2 (two) times daily.     topiramate (TOPAMAX) 50 MG tablet Take 2 tablets by mouth twice daily     No current facility-administered medications on file prior to visit.   Past Medical History:  Diagnosis Date   Seizures ([place])    "last seizure 2015"   Sleep apnea    Past Surgical History:  Procedure Laterality Date   BREAST REDUCTION SURGERY Bilateral 07/05/2020   Procedure: BILATERAL BREAST REDUCTION;  Surgeon: Wallace Going, DO;  Location: [place];  Service: Plastics;  Laterality: Bilateral;  3.5 hours   NO PAST SURGERIES     No Known Allergies  Family History  Problem Relation Age of Onset   Diabetes Mother    Hypertension Mother    Diabetes Father    Social History   Socioeconomic History   Marital status: Single    Spouse name: Not on file   Number of children: Not on file   Years of education: Not on file   Highest education level: Not on file  Occupational  History   Not on file  Tobacco Use   Smoking status: Never   Smokeless tobacco: Never  Vaping Use   Vaping Use: Never used  Substance and Sexual Activity   Alcohol use: No   Drug use: No   Sexual activity: Not Currently  Other Topics Concern   Not on file  Social History Narrative   Not on file   Social Determinants of Health   Financial Resource Strain: Not on file  Food Insecurity: Not on file  Transportation Needs: Not on file   Physical Activity: Not on file  Stress: Not on file  Social Connections: Not on file   Vitals:   03/07/22 1455  BP: 128/76  Pulse: 98  Resp: 12  SpO2: 97%  Body mass index is 38.76 kg/m. Wt Readings from Last 3 Encounters:  03/07/22 240 lb 2 oz (108.9 kg)  09/08/20 239 lb (108.4 kg)  07/05/20 231 lb 4.2 oz (104.9 kg)  Physical Exam Vitals and nursing note reviewed.  Constitutional:      General: She is not in acute distress.    Appearance: She is well-developed.  HENT:     Head: Normocephalic and atraumatic.     Right Ear: Hearing, tympanic membrane, ear canal and external ear normal.     Left Ear: Hearing, tympanic membrane, ear canal and external ear normal.     Mouth/Throat:     Mouth: Mucous membranes are moist.     Pharynx: Oropharynx is clear. Uvula midline.  Eyes:     Extraocular Movements: Extraocular movements intact.     Conjunctiva/sclera: Conjunctivae normal.     Pupils: Pupils are equal, round, and reactive to light.  Neck:     Thyroid: No thyroid mass or thyromegaly.     Trachea: No tracheal deviation.  Cardiovascular:     Rate and Rhythm: Normal rate and regular rhythm.     Pulses:          Dorsalis pedis pulses are 2+ on the right side and 2+ on the left side.     Heart sounds: No murmur heard. Pulmonary:     Effort: Pulmonary effort is normal. No respiratory distress.     Breath sounds: Normal breath sounds.  Abdominal:     Palpations: Abdomen is soft. There is no hepatomegaly or mass.     Tenderness: There is no abdominal tenderness.  Genitourinary:    Comments: Deferred to gyn. Musculoskeletal:     Comments: No major deformity or signs of synovitis appreciated.  Lymphadenopathy:     Cervical: No cervical adenopathy.     Upper Body:     Right upper body: No supraclavicular adenopathy.     Left upper body: No supraclavicular adenopathy.  Skin:    General: Skin is warm.     Findings: No erythema or rash.  Neurological:     General: No focal  deficit present.     Mental Status: She is alert and oriented to person, place, and time.     Cranial Nerves: No cranial nerve deficit.     Coordination: Coordination normal.     Gait: Gait normal.     Deep Tendon Reflexes:     Reflex Scores:      Bicep reflexes are 2+ on the right side and 2+ on the left side.      Patellar reflexes are 2+ on the right side and 2+ on the left side. Psychiatric:     Comments: Well groomed, good eye contact.  ASSESSMENT AND PLAN:  [name] was here today annual physical examination.  Orders Placed This Encounter  Procedures   Hemoglobin A1c   Hepatitis C antibody   Lipid panel   BMP with eGFR([facility])   Amb Ref to Medical Weight Management   Lab Results  Component Value Date   CREATININE 0.83 03/07/2022   BUN 12 03/07/2022   NA 139 03/07/2022   K 4.1 03/07/2022   CL 106 03/07/2022   CO2 24 03/07/2022   Lab Results  Component Value Date   HGBA1C 5.3 03/07/2022   Lab Results  Component Value Date   CHOL 190 03/07/2022   HDL 53 03/07/2022   LDLCALC 117 (H) 03/07/2022   TRIG 95 03/07/2022   CHOLHDL 3.6 03/07/2022   Routine general medical examination at a health care facility We discussed the importance of regular physical activity and healthy diet for prevention of chronic illness and/or complications. Preventive guidelines reviewed. Vaccination up to date. Declined pap smear, she will arrange appt with gyn for her pap smear. Next CPE in a year.  Screening for endocrine, metabolic and immunity disorder -     BMP with eGFR([facility]) -     Hemoglobin A1c  Encounter for HCV screening test for low risk patient -     Hepatitis C antibody  Dyslipidemia (high LDL; low HDL) Low-fat diet recommended. Further recommendation will be given according to lipid panel results.  Seizure ([place]) Problem has been well controlled. She follows with neurologist annually.  Class 2 obesity with body mass index (BMI) of 38.0 to 38.9 in  adult She understands the benefits of wt loss as well as adverse effects of obesity. Consistency with healthy diet and physical activity encouraged. Making positive life style changes, small steps at the time, will provide long lasting results with minimal risk for adverse effects; so I do not recommend pharmacologic treatment.  Return in 1 year (on 03/08/2023) for CPE.  Jemimah Cressy G. [place], MD  [facility]. [place] office.

## 2022-03-07 ENCOUNTER — Ambulatory Visit (INDEPENDENT_AMBULATORY_CARE_PROVIDER_SITE_OTHER): Payer: BC Managed Care – PPO | Admitting: Family Medicine

## 2022-03-07 ENCOUNTER — Encounter: Payer: Self-pay | Admitting: Family Medicine

## 2022-03-07 VITALS — BP 128/76 | HR 98 | Resp 12 | Ht 66.0 in | Wt 240.1 lb

## 2022-03-07 DIAGNOSIS — Z13228 Encounter for screening for other metabolic disorders: Secondary | ICD-10-CM | POA: Diagnosis not present

## 2022-03-07 DIAGNOSIS — R569 Unspecified convulsions: Secondary | ICD-10-CM

## 2022-03-07 DIAGNOSIS — E669 Obesity, unspecified: Secondary | ICD-10-CM | POA: Insufficient documentation

## 2022-03-07 DIAGNOSIS — Z6838 Body mass index (BMI) 38.0-38.9, adult: Secondary | ICD-10-CM

## 2022-03-07 DIAGNOSIS — Z1159 Encounter for screening for other viral diseases: Secondary | ICD-10-CM

## 2022-03-07 DIAGNOSIS — Z1329 Encounter for screening for other suspected endocrine disorder: Secondary | ICD-10-CM | POA: Diagnosis not present

## 2022-03-07 DIAGNOSIS — Z130 Encounter for screening for diseases of the blood and blood-forming organs and certain disorders involving the immune mechanism: Secondary | ICD-10-CM

## 2022-03-07 DIAGNOSIS — E785 Hyperlipidemia, unspecified: Secondary | ICD-10-CM | POA: Diagnosis not present

## 2022-03-07 DIAGNOSIS — Z0000 Encounter for general adult medical examination without abnormal findings: Secondary | ICD-10-CM

## 2022-03-07 NOTE — Assessment & Plan Note (Signed)
She understands the benefits of wt loss as well as adverse effects of obesity. Consistency with healthy diet and physical activity encouraged. Making positive life style changes, small steps at the time, will provide long lasting results with minimal risk for adverse effects; so I do not recommend pharmacologic treatment.

## 2022-03-07 NOTE — Assessment & Plan Note (Signed)
Problem has been well controlled. She follows with neurologist annually.

## 2022-03-07 NOTE — Assessment & Plan Note (Signed)
Low-fat diet recommended. Further recommendation will be given according to lipid panel results.

## 2022-03-07 NOTE — Patient Instructions (Addendum)
A few things to remember from today's visit:  Routine general medical examination at a health care facility  Dyslipidemia (high LDL; low HDL) - Plan: Lipid panel  Screening for endocrine, metabolic and immunity disorder - Plan: Hemoglobin A1c, BMP with eGFR([facility])  Encounter for HCV screening test for low risk patient - Plan: Hepatitis C antibody  Seizure ([facility]), Chronic  Class 2 severe obesity due to excess calories with serious comorbidity and body mass index (BMI) of 38.0 to 38.9 in adult [facility]) - Plan: Amb Ref to Medical Weight Management  Do not use My Chart to request refills or for acute issues that need immediate attention.   Please be sure medication list is accurate. If a new problem present, please set up appointment sooner than planned today.  Health Maintenance, Female Adopting a healthy lifestyle and getting preventive care are important in promoting health and wellness. Ask your health care provider about: The right schedule for you to have regular tests and exams. Things you can do on your own to prevent diseases and keep yourself healthy. What should I know about diet, weight, and exercise? Eat a healthy diet  Eat a diet that includes plenty of vegetables, fruits, low-fat dairy products, and lean protein. Do not eat a lot of foods that are high in solid fats, added sugars, or sodium. Maintain a healthy weight Body mass index (BMI) is used to identify weight problems. It estimates body fat based on height and weight. Your health care provider can help determine your BMI and help you achieve or maintain a healthy weight. Get regular exercise Get regular exercise. This is one of the most important things you can do for your health. Most adults should: Exercise for at least 150 minutes each week. The exercise should increase your heart rate and make you sweat (moderate-intensity exercise). Do strengthening exercises at least twice a week. This is in addition to the  moderate-intensity exercise. Spend less time sitting. Even light physical activity can be beneficial. Watch cholesterol and blood lipids Have your blood tested for lipids and cholesterol at 35 years of age, then have this test every 5 years. Have your cholesterol levels checked more often if: Your lipid or cholesterol levels are high. You are older than 35 years of age. You are at high risk for heart disease. What should I know about cancer screening? Depending on your health history and family history, you may need to have cancer screening at various ages. This may include screening for: Breast cancer. Cervical cancer. Colorectal cancer. Skin cancer. Lung cancer. What should I know about heart disease, diabetes, and high blood pressure? Blood pressure and heart disease High blood pressure causes heart disease and increases the risk of stroke. This is more likely to develop in people who have high blood pressure readings or are overweight. Have your blood pressure checked: Every 3-5 years if you are 34-20 years of age. Every year if you are 27 years old or older. Diabetes Have regular diabetes screenings. This checks your fasting blood sugar level. Have the screening done: Once every three years after age 42 if you are at a normal weight and have a low risk for diabetes. More often and at a younger age if you are overweight or have a high risk for diabetes. What should I know about preventing infection? Hepatitis B If you have a higher risk for hepatitis B, you should be screened for this virus. Talk with your health care provider to find out if you  are at risk for hepatitis B infection. Hepatitis C Testing is recommended for: Everyone born from [birth year] through 1964. Anyone with known risk factors for hepatitis C. Sexually transmitted infections (STIs) Get screened for STIs, including gonorrhea and chlamydia, if: You are sexually active and are younger than 35 years of age. You are  older than 35 years of age and your health care provider tells you that you are at risk for this type of infection. Your sexual activity has changed since you were last screened, and you are at increased risk for chlamydia or gonorrhea. Ask your health care provider if you are at risk. Ask your health care provider about whether you are at high risk for HIV. Your health care provider may recommend a prescription medicine to help prevent HIV infection. If you choose to take medicine to prevent HIV, you should first get tested for HIV. You should then be tested every 3 months for as long as you are taking the medicine. Pregnancy If you are about to stop having your period (premenopausal) and you may become pregnant, seek counseling before you get pregnant. Take 400 to 800 micrograms (mcg) of folic acid every day if you become pregnant. Ask for birth control (contraception) if you want to prevent pregnancy. Osteoporosis and menopause Osteoporosis is a disease in which the bones lose minerals and strength with aging. This can result in bone fractures. If you are 69 years old or older, or if you are at risk for osteoporosis and fractures, ask your health care provider if you should: Be screened for bone loss. Take a calcium or vitamin D supplement to lower your risk of fractures. Be given hormone replacement therapy (HRT) to treat symptoms of menopause. Follow these instructions at home: Alcohol use Do not drink alcohol if: Your health care provider tells you not to drink. You are pregnant, may be pregnant, or are planning to become pregnant. If you drink alcohol: Limit how much you have to: 0-1 drink a day. Know how much alcohol is in your drink. In the [place] one drink equals one 12 oz bottle of beer (355 mL), one 5 oz glass of wine (148 mL), or one 1 oz glass of hard liquor (44 mL). Lifestyle Do not use any products that contain nicotine or tobacco. These products include cigarettes, chewing  tobacco, and vaping devices, such as e-cigarettes. If you need help quitting, ask your health care provider. Do not use street drugs. Do not share needles. Ask your health care provider for help if you need support or information about quitting drugs. General instructions Schedule regular health, dental, and eye exams. Stay current with your vaccines. Tell your health care provider if: You often feel depressed. You have ever been abused or do not feel safe at home. Summary Adopting a healthy lifestyle and getting preventive care are important in promoting health and wellness. Follow your health care provider's instructions about healthy diet, exercising, and getting tested or screened for diseases. Follow your health care provider's instructions on monitoring your cholesterol and blood pressure. This information is not intended to replace advice given to you by your health care provider. Make sure you discuss any questions you have with your health care provider. Document Revised: 01/07/2021 Document Reviewed: 01/07/2021 Elsevier Patient Education  [place].

## 2022-03-10 LAB — BASIC METABOLIC PANEL WITH GFR
BUN: 12 mg/dL (ref 7–25)
CO2: 24 mmol/L (ref 20–32)
Calcium: 9.4 mg/dL (ref 8.6–10.2)
Chloride: 106 mmol/L (ref 98–110)
Creat: 0.83 mg/dL (ref 0.50–0.97)
Glucose, Bld: 80 mg/dL (ref 65–99)
Potassium: 4.1 mmol/L (ref 3.5–5.3)
Sodium: 139 mmol/L (ref 135–146)
eGFR: 95 mL/min/{1.73_m2} (ref >=60)

## 2022-03-10 LAB — HEMOGLOBIN A1C
Hgb A1c MFr Bld: 5.3 % of total Hgb (ref <5.7)
Mean Plasma Glucose: 105 mg/dL
eAG (mmol/L): 5.8 mmol/L

## 2022-03-10 LAB — LIPID PANEL
Cholesterol: 190 mg/dL (ref <200)
HDL: 53 mg/dL (ref >=50)
LDL Cholesterol (Calc): 117 mg/dL (calc) — ABNORMAL HIGH
Non-HDL Cholesterol (Calc): 137 mg/dL (calc) — ABNORMAL HIGH (ref <130)
Total CHOL/HDL Ratio: 3.6 (calc) (ref <5.0)
Triglycerides: 95 mg/dL (ref <150)

## 2022-03-10 LAB — HEPATITIS C ANTIBODY: Hepatitis C Ab: NONREACTIVE

## 2022-07-02 DIAGNOSIS — G40309 Generalized idiopathic epilepsy and epileptic syndromes, not intractable, without status epilepticus: Secondary | ICD-10-CM | POA: Diagnosis not present

## 2023-05-23 ENCOUNTER — Ambulatory Visit
Admission: EM | Admit: 2023-05-23 | Discharge: 2023-05-23 | Disposition: A | Discharge status: Home / Self Care | Payer: BC Managed Care – PPO | Attending: Internal Medicine | Admitting: Internal Medicine

## 2023-05-23 DIAGNOSIS — B349 Viral infection, unspecified: Secondary | ICD-10-CM

## 2023-05-23 DIAGNOSIS — J029 Acute pharyngitis, unspecified: Secondary | ICD-10-CM

## 2023-05-23 DIAGNOSIS — Z1152 Encounter for screening for COVID-19: Secondary | ICD-10-CM | POA: Diagnosis not present

## 2023-05-23 DIAGNOSIS — B309 Viral conjunctivitis, unspecified: Secondary | ICD-10-CM | POA: Diagnosis not present

## 2023-05-23 LAB — POCT RAPID STREP A (OFFICE): Rapid Strep A Screen: NEGATIVE

## 2023-05-23 MED ORDER — BENZONATATE 200 MG PO CAPS
200.0000 mg | ORAL_CAPSULE | Freq: Three times a day (TID) | ORAL | 0 refills | Status: DC | PRN
Start: 2023-05-23 — End: 2023-12-02

## 2023-05-23 MED ORDER — OLOPATADINE HCL 0.1 % OP SOLN: 1.0000 [drp] | Freq: Two times a day (BID) | OPHTHALMIC | 12 refills | Status: DC | PRN

## 2023-05-23 NOTE — ED Provider Notes (Signed)
[facility]    CSN: [id number] Arrival date & time: 05/23/23  0913      History   Chief Complaint Chief Complaint  Patient presents with   Eye Problem   Sore Throat    HPI Brooke Alexander is a 37 y.o. female  presents for evaluation of URI symptoms for 2 days. Patient reports associated symptoms of sore throat, congestion, watery/irritated eyes, body aches. Denies N/V/D, fevers, cough, shortness of breath. Patient does not have a hx of asthma. Patient does not have a history of smoking.  Reports mother has similar symptoms.  Pt has taken nothing OTC for symptoms. Pt has no other concerns at this time.    Eye Problem Sore Throat    Past Medical History:  Diagnosis Date   Seizures (H[facility])    "last seizure 2015"   Sleep apnea     Patient Active Problem List   Diagnosis Date Noted   Dyslipidemia (high LDL; low HDL) 03/07/2022   Class 2 obesity with body mass index (BMI) of 38.0 to 38.9 in adult 03/07/2022   Neck pain 03/06/2020   Symptomatic mammary hypertrophy 03/06/2020   Seizure ([facility]) 12/31/2019   Back pain 07/28/2017   OSA (obstructive sleep apnea) 07/28/2017    Past Surgical History:  Procedure Laterality Date   BREAST REDUCTION SURGERY Bilateral 07/05/2020   Procedure: BILATERAL BREAST REDUCTION;  Surgeon: Peggye Form, DO;  Location: [facility];  Service: Plastics;  Laterality: Bilateral;  3.5 hours   NO PAST SURGERIES      OB History   No obstetric history on file.      Home Medications    Prior to Admission medications   Medication Sig Start Date End Date Taking? Authorizing Provider  benzonatate (TESSALON) 200 MG capsule Take 1 capsule (200 mg total) by mouth 3 (three) times daily as needed. 05/23/23  Yes Radford Pax, NP  metFORMIN (GLUCOPHAGE-XR) 500 MG 24 hr tablet Take 500 mg by mouth 2 (two) times daily. 05/01/23  Yes [provider]  olopatadine (PATADAY) 0.1 % ophthalmic solution Place 1 drop into both  eyes 2 (two) times daily as needed (eye irritation). 05/23/23  Yes Radford Pax, NP  levETIRAcetam (KEPPRA) 500 MG tablet Take 500-1,000 mg by mouth 2 (two) times daily.    [provider]  topiramate (TOPAMAX) 50 MG tablet Take 2 tablets by mouth twice daily 12/22/19   [provider]    Family History Family History  Problem Relation Age of Onset   Diabetes Mother    Hypertension Mother    Diabetes Father     Social History Social History   Tobacco Use   Smoking status: Never   Smokeless tobacco: Never  Vaping Use   Vaping status: Never Used  Substance Use Topics   Alcohol use: No   Drug use: No     Allergies   Patient has no known allergies.   Review of Systems Review of Systems  HENT:  Positive for congestion and sore throat.   Eyes:        Watery irritated eyes  Musculoskeletal:  Positive for myalgias.     Physical Exam Triage Vital Signs ED Triage Vitals  Encounter Vitals Group     BP 05/23/23 0917 (!) 163/86     Systolic BP Percentile --      Diastolic BP Percentile --      Pulse Rate 05/23/23 0917 98     Resp 05/23/23 0917 18  Temp 05/23/23 0917 99 F (37.2 C)     Temp Source 05/23/23 0917 Oral     SpO2 05/23/23 0917 94 %     Weight --      Height --      Head Circumference --      Peak Flow --      Pain Score 05/23/23 0920 5     Pain Loc --      Pain Education --      Exclude from Growth Chart --    No data found.  Updated Vital Signs BP (!) 163/86 (BP Location: Right Arm)   Pulse 98   Temp 99 F (37.2 C) (Oral)   Resp 18   LMP  (Within Weeks)   SpO2 94%   Visual Acuity Right Eye Distance:   Left Eye Distance:   Bilateral Distance:    Right Eye Near:   Left Eye Near:    Bilateral Near:     Physical Exam Vitals and nursing note reviewed.  Constitutional:      General: She is not in acute distress.    Appearance: She is well-developed. She is not ill-appearing.  HENT:     Head: Normocephalic and  atraumatic.     Right Ear: Tympanic membrane and ear canal normal.     Left Ear: Tympanic membrane and ear canal normal.     Nose: Congestion present.     Mouth/Throat:     Mouth: Mucous membranes are moist.     Pharynx: Oropharynx is clear. Uvula midline. Posterior oropharyngeal erythema present.     Tonsils: No tonsillar exudate or tonsillar abscesses.  Eyes:     General: Lids are normal.        Right eye: No foreign body or hordeolum.        Left eye: No foreign body or hordeolum.     Conjunctiva/sclera: Conjunctivae normal.     Right eye: Right conjunctiva is not injected. No chemosis, exudate or hemorrhage.    Left eye: Left conjunctiva is not injected. No chemosis or hemorrhage.    Pupils: Pupils are equal, round, and reactive to light.     Comments: Mild watery drainage bilaterally without purulent discharge.  Cardiovascular:     Rate and Rhythm: Normal rate and regular rhythm.     Heart sounds: Normal heart sounds.  Pulmonary:     Effort: Pulmonary effort is normal.     Breath sounds: Normal breath sounds.  Musculoskeletal:     Cervical back: Normal range of motion and neck supple.  Lymphadenopathy:     Cervical: No cervical adenopathy.  Skin:    General: Skin is warm and dry.  Neurological:     General: No focal deficit present.     Mental Status: She is alert and oriented to person, place, and time.  Psychiatric:        Mood and Affect: Mood normal.        Behavior: Behavior normal.      UC Treatments / Results  Labs (all labs ordered are listed, but only abnormal results are displayed) Labs Reviewed  SARS CORONAVIRUS 2 (TAT 6-24 HRS)  CULTURE, GROUP A STREP [facility])  POCT RAPID STREP A (OFFICE)    EKG   Radiology No results found.  Procedures Procedures (including critical care time)  Medications Ordered in UC Medications - No data to display  Initial Impression / Assessment and Plan / UC Course  I have reviewed the triage vital signs and  the  nursing notes.  Pertinent labs & imaging results that were available during my care of the patient were reviewed by me and considered in my medical decision making (see chart for details).     Reviewed exam and symptoms with patient.  No red flags.  Negative rapid strep, will culture.  COVID PCR and will contact if positive.  Discussed viral illness and symptomatic treatment.  Tessalon as needed for cough.  Antihistamine eyedrops as needed for viral conjunctivitis symptom relief.  Warm compresses as needed.  PCP follow-up 2 days for recheck.  ER precautions reviewed and patient verbalized understanding.   Final Clinical Impressions(s) / UC Diagnoses   Final diagnoses:  Sore throat  Viral illness  Viral conjunctivitis     Discharge Instructions      The clinic will contact you with results of the COVID test done today if positive.  You may use Tessalon as needed for cough.  Pataday antihistamine eyedrops as needed for your eye irritation/watery eyes.  You may do warm compresses to the eyes as needed.  Lots of rest and fluids.  Please follow-up with your PCP in 2 days for recheck.  Please go to the ER for any worsening symptoms.  I hope you feel better soon!     ED Prescriptions     Medication Sig Dispense Auth. Provider   benzonatate (TESSALON) 200 MG capsule Take 1 capsule (200 mg total) by mouth 3 (three) times daily as needed. 20 capsule Radford Pax, NP   olopatadine (PATADAY) 0.1 % ophthalmic solution Place 1 drop into both eyes 2 (two) times daily as needed (eye irritation). 5 mL Radford Pax, NP      PDMP not reviewed this encounter.   Radford Pax, NP 05/23/23 [phone]

## 2023-05-23 NOTE — Discharge Instructions (Signed)
The clinic will contact you with results of the COVID test done today if positive.  You may use Tessalon as needed for cough.  Pataday antihistamine eyedrops as needed for your eye irritation/watery eyes.  You may do warm compresses to the eyes as needed.  Lots of rest and fluids.  Please follow-up with your PCP in 2 days for recheck.  Please go to the ER for any worsening symptoms.  I hope you feel better soon!

## 2023-05-23 NOTE — ED Triage Notes (Signed)
Pt reports "cold coming out of eyes", sore throat, legs aches and congestion x 2 days. Pt has not take any meds for complaints.

## 2023-05-24 LAB — SARS CORONAVIRUS 2 (TAT 6-24 HRS): SARS Coronavirus 2: NEGATIVE

## 2023-05-26 LAB — CULTURE, GROUP A STREP (THRC)

## 2023-09-25 IMAGING — DX DG CHEST 2V
2 series · 2 of 2 positions shown · non-contrast
Comparison: 04/11/2020

CLINICAL DATA: Cough.  Dyspnea.  Covid-0O viral infection.

EXAM:
CHEST - 2 VIEW

[chest pa]
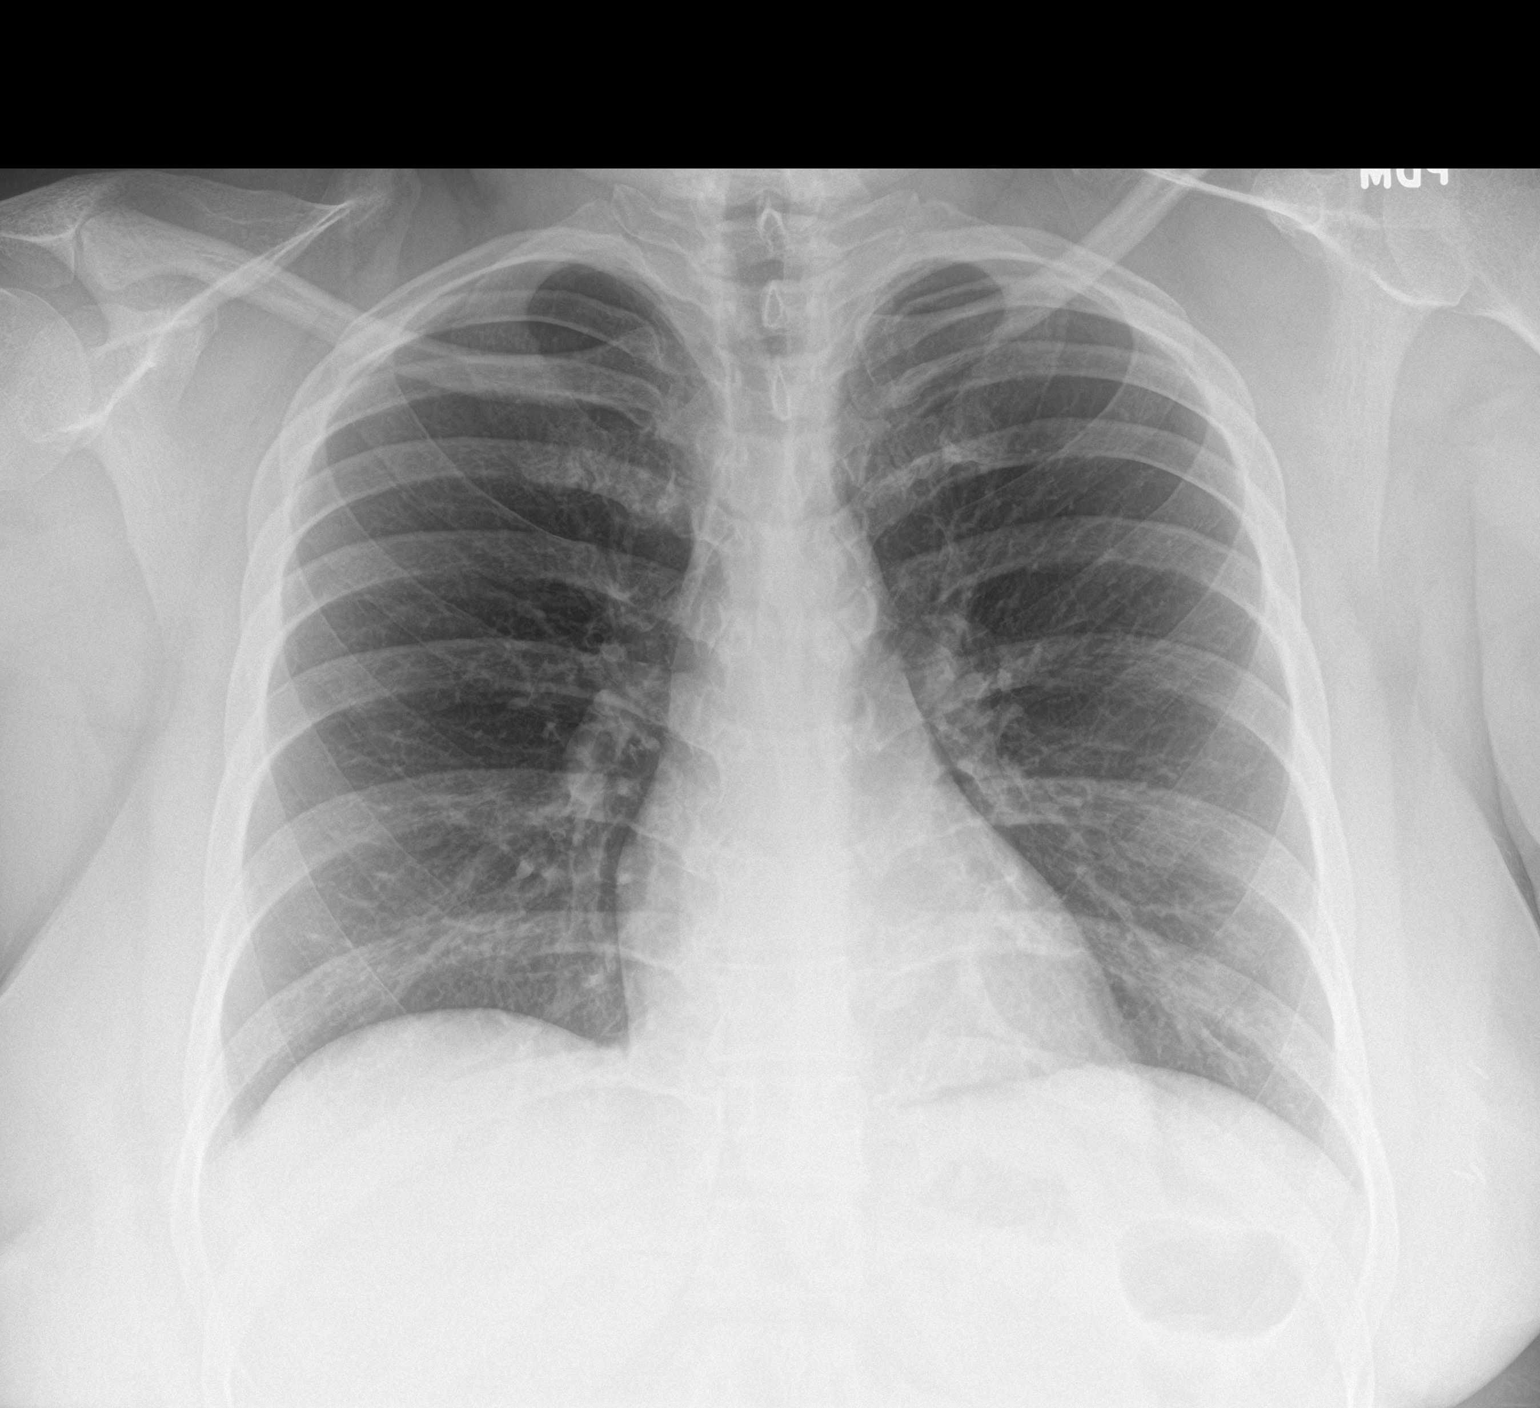

[chest lat]
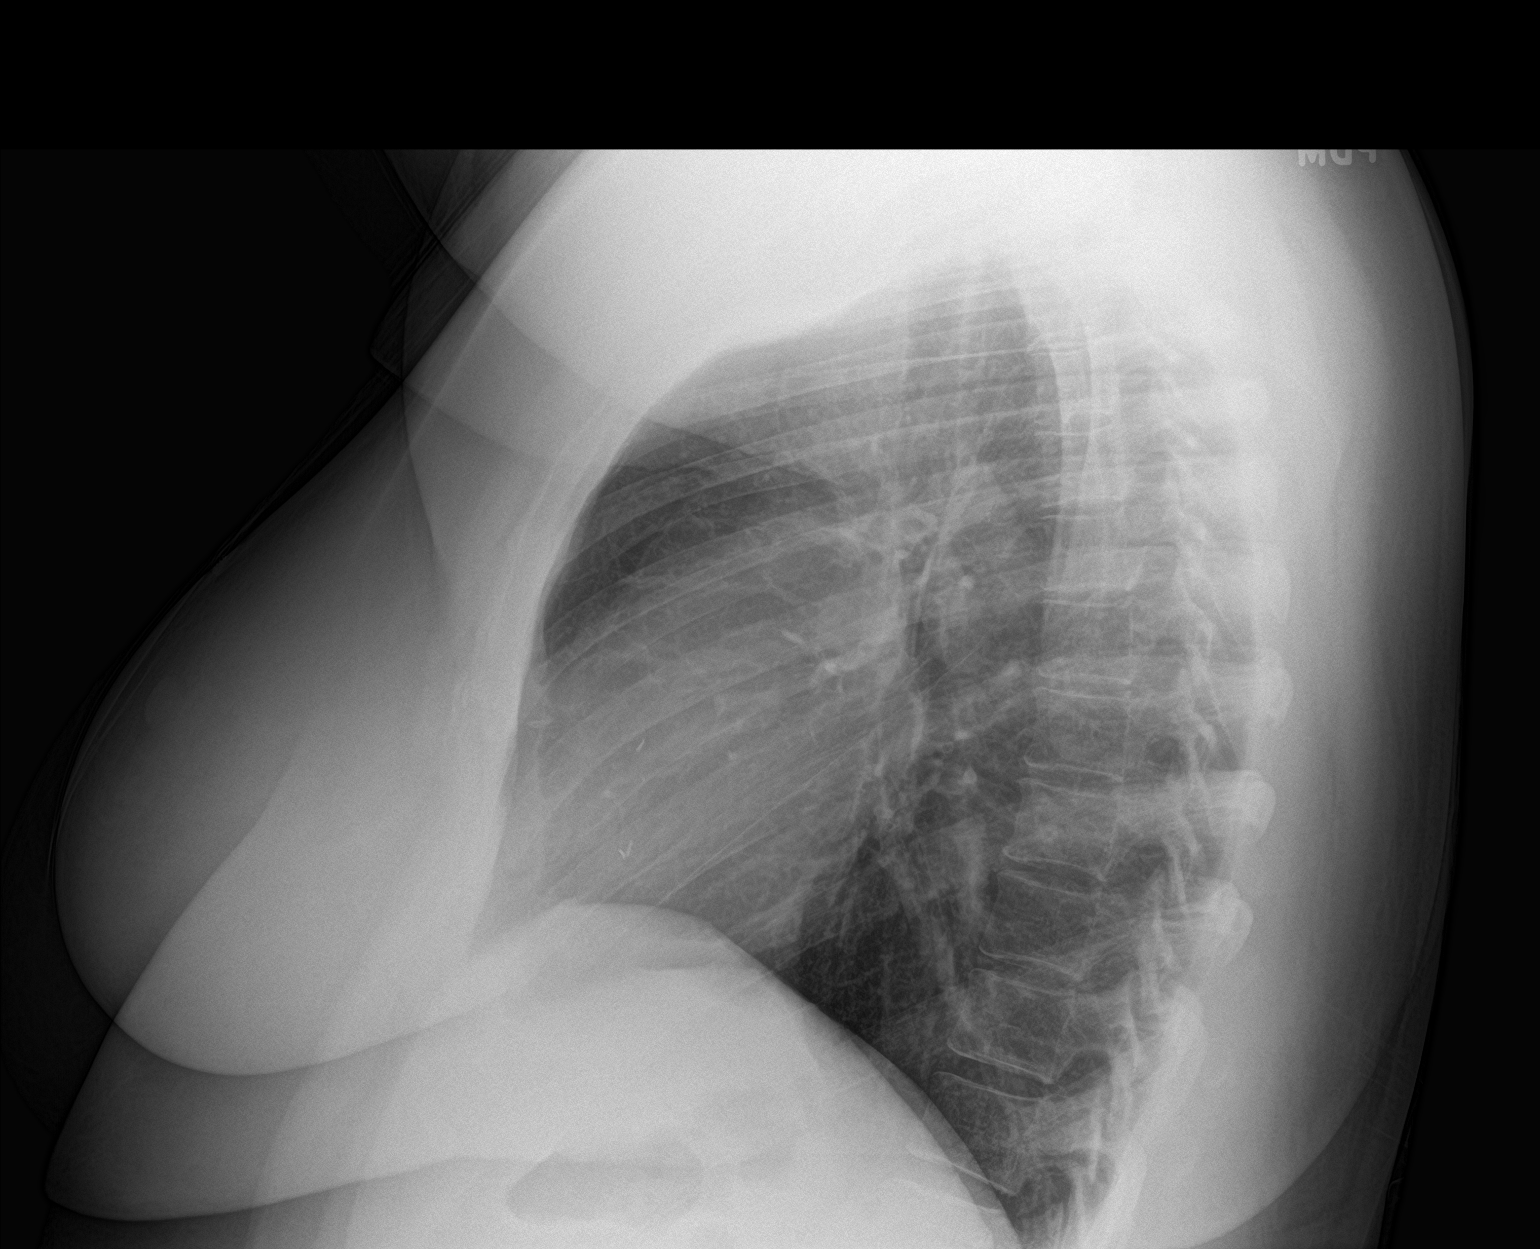

[2 of 2 positions shown; findings below may reference images not displayed]

FINDINGS: The heart size and mediastinal contours are within normal limits.
Both lungs are clear. The visualized skeletal structures are
unremarkable.
IMPRESSION: No active cardiopulmonary disease.

## 2023-10-23 ENCOUNTER — Ambulatory Visit: Payer: BC Managed Care – PPO | Admitting: Family Medicine

## 2023-10-23 VITALS — BP 118/86 | HR 88 | Temp 98.4°F | Resp 16 | Ht 65.8 in | Wt 258.0 lb

## 2023-10-23 DIAGNOSIS — Z1329 Encounter for screening for other suspected endocrine disorder: Secondary | ICD-10-CM

## 2023-10-23 DIAGNOSIS — E785 Hyperlipidemia, unspecified: Secondary | ICD-10-CM

## 2023-10-23 DIAGNOSIS — E66812 Obesity, class 2: Secondary | ICD-10-CM | POA: Diagnosis not present

## 2023-10-23 DIAGNOSIS — Z0000 Encounter for general adult medical examination without abnormal findings: Secondary | ICD-10-CM

## 2023-10-23 DIAGNOSIS — G4733 Obstructive sleep apnea (adult) (pediatric): Secondary | ICD-10-CM

## 2023-10-23 DIAGNOSIS — R569 Unspecified convulsions: Secondary | ICD-10-CM

## 2023-10-23 DIAGNOSIS — Z13228 Encounter for screening for other metabolic disorders: Secondary | ICD-10-CM | POA: Diagnosis not present

## 2023-10-23 DIAGNOSIS — L309 Dermatitis, unspecified: Secondary | ICD-10-CM

## 2023-10-23 DIAGNOSIS — Z130 Encounter for screening for diseases of the blood and blood-forming organs and certain disorders involving the immune mechanism: Secondary | ICD-10-CM

## 2023-10-23 DIAGNOSIS — Z6838 Body mass index (BMI) 38.0-38.9, adult: Secondary | ICD-10-CM | POA: Diagnosis not present

## 2023-10-23 LAB — LIPID PANEL
Cholesterol: 198 mg/dL (ref 0–200)
HDL: 43.5 mg/dL (ref >39.00)
LDL Cholesterol: 133 mg/dL — ABNORMAL HIGH (ref 0–99)
NonHDL: 154.92
Total CHOL/HDL Ratio: 5
Triglycerides: 108 mg/dL (ref 0.0–149.0)
VLDL: 21.6 mg/dL (ref 0.0–40.0)

## 2023-10-23 LAB — BASIC METABOLIC PANEL
BUN: 11 mg/dL (ref 6–23)
CO2: 25 meq/L (ref 19–32)
Calcium: 9.4 mg/dL (ref 8.4–10.5)
Chloride: 105 meq/L (ref 96–112)
Creatinine, Ser: 0.84 mg/dL (ref 0.40–1.20)
GFR: 89.39 mL/min (ref >60.00)
Glucose, Bld: 91 mg/dL (ref 70–99)
Potassium: 3.7 meq/L (ref 3.5–5.1)
Sodium: 137 meq/L (ref 135–145)

## 2023-10-23 LAB — TSH: TSH: 0.68 u[IU]/mL (ref 0.35–5.50)

## 2023-10-23 LAB — HEMOGLOBIN A1C: Hgb A1c MFr Bld: 5.8 % (ref 4.6–6.5)

## 2023-10-23 NOTE — Assessment & Plan Note (Signed)
LDL 124 and HDL 40 in 07/2023. Recommend low-fat diet. Further recommendation will be given according to lab results.

## 2023-10-23 NOTE — Patient Instructions (Addendum)
A few things to remember from today's visit:  Routine general medical examination at a health care facility - Plan: Ambulatory referral to Obstetrics / Gynecology  Dyslipidemia (high LDL; low HDL) - Plan: Lipid panel  Class 2 severe obesity due to excess calories with serious comorbidity and body mass index (BMI) of 38.0 to 38.9 in adult [facility]) - Plan: Amb Ref to Medical Weight Management, Hemoglobin A1c, TSH, Basic metabolic panel  Screening for endocrine, metabolic and immunity disorder - Plan: Hemoglobin A1c, Basic metabolic panel  OSA (obstructive sleep apnea) - Plan: Ambulatory referral to Pulmonology  Eczema of both hands - Plan: Ambulatory referral to Dermatology  Do not use My Chart to request refills or for acute issues that need immediate attention. If you send a my chart message, it may take a few days to be addressed, specially if I am not in the office.  Please be sure medication list is accurate. If a new problem present, please set up appointment sooner than planned today.  Health Maintenance, Female Adopting a healthy lifestyle and getting preventive care are important in promoting health and wellness. Ask your health care provider about: The right schedule for you to have regular tests and exams. Things you can do on your own to prevent diseases and keep yourself healthy. What should I know about diet, weight, and exercise? Eat a healthy diet  Eat a diet that includes plenty of vegetables, fruits, low-fat dairy products, and lean protein. Do not eat a lot of foods that are high in solid fats, added sugars, or sodium. Maintain a healthy weight Body mass index (BMI) is used to identify weight problems. It estimates body fat based on height and weight. Your health care provider can help determine your BMI and help you achieve or maintain a healthy weight. Get regular exercise Get regular exercise. This is one of the most important things you can do for your health. Most  adults should: Exercise for at least 150 minutes each week. The exercise should increase your heart rate and make you sweat (moderate-intensity exercise). Do strengthening exercises at least twice a week. This is in addition to the moderate-intensity exercise. Spend less time sitting. Even light physical activity can be beneficial. Watch cholesterol and blood lipids Have your blood tested for lipids and cholesterol at 37 years of age, then have this test every 5 years. Have your cholesterol levels checked more often if: Your lipid or cholesterol levels are high. You are older than 37 years of age. You are at high risk for heart disease. What should I know about cancer screening? Depending on your health history and family history, you may need to have cancer screening at various ages. This may include screening for: Breast cancer. Cervical cancer. Colorectal cancer. Skin cancer. Lung cancer. What should I know about heart disease, diabetes, and high blood pressure? Blood pressure and heart disease High blood pressure causes heart disease and increases the risk of stroke. This is more likely to develop in people who have high blood pressure readings or are overweight. Have your blood pressure checked: Every 3-5 years if you are 59-81 years of age. Every year if you are 51 years old or older. Diabetes Have regular diabetes screenings. This checks your fasting blood sugar level. Have the screening done: Once every three years after age 59 if you are at a normal weight and have a low risk for diabetes. More often and at a younger age if you are overweight or have a  high risk for diabetes. What should I know about preventing infection? Hepatitis B If you have a higher risk for hepatitis B, you should be screened for this virus. Talk with your health care provider to find out if you are at risk for hepatitis B infection. Hepatitis C Testing is recommended for: Everyone born from [birth year]  through 1965. Anyone with known risk factors for hepatitis C. Sexually transmitted infections (STIs) Get screened for STIs, including gonorrhea and chlamydia, if: You are sexually active and are younger than 37 years of age. You are older than 37 years of age and your health care provider tells you that you are at risk for this type of infection. Your sexual activity has changed since you were last screened, and you are at increased risk for chlamydia or gonorrhea. Ask your health care provider if you are at risk. Ask your health care provider about whether you are at high risk for HIV. Your health care provider may recommend a prescription medicine to help prevent HIV infection. If you choose to take medicine to prevent HIV, you should first get tested for HIV. You should then be tested every 3 months for as long as you are taking the medicine. Pregnancy If you are about to stop having your period (premenopausal) and you may become pregnant, seek counseling before you get pregnant. Take 400 to 800 micrograms (mcg) of folic acid every day if you become pregnant. Ask for birth control (contraception) if you want to prevent pregnancy. Osteoporosis and menopause Osteoporosis is a disease in which the bones lose minerals and strength with aging. This can result in bone fractures. If you are 14 years old or older, or if you are at risk for osteoporosis and fractures, ask your health care provider if you should: Be screened for bone loss. Take a calcium or vitamin D supplement to lower your risk of fractures. Be given hormone replacement therapy (HRT) to treat symptoms of menopause. Follow these instructions at home: Alcohol use Do not drink alcohol if: Your health care provider tells you not to drink. You are pregnant, may be pregnant, or are planning to become pregnant. If you drink alcohol: Limit how much you have to: 0-1 drink a day. Know how much alcohol is in your drink. In the [place] one  drink equals one 12 oz bottle of beer (355 mL), one 5 oz glass of wine (148 mL), or one 1 oz glass of hard liquor (44 mL). Lifestyle Do not use any products that contain nicotine or tobacco. These products include cigarettes, chewing tobacco, and vaping devices, such as e-cigarettes. If you need help quitting, ask your health care provider. Do not use street drugs. Do not share needles. Ask your health care provider for help if you need support or information about quitting drugs. General instructions Schedule regular health, dental, and eye exams. Stay current with your vaccines. Tell your health care provider if: You often feel depressed. You have ever been abused or do not feel safe at home. Summary Adopting a healthy lifestyle and getting preventive care are important in promoting health and wellness. Follow your health care provider's instructions about healthy diet, exercising, and getting tested or screened for diseases. Follow your health care provider's instructions on monitoring your cholesterol and blood pressure. This information is not intended to replace advice given to you by your health care provider. Make sure you discuss any questions you have with your health care provider. Document Revised: 01/07/2021 Document Reviewed: 01/07/2021 Elsevier Patient  Education  2024 [employer].

## 2023-10-23 NOTE — Assessment & Plan Note (Signed)
We discussed the importance of regular physical activity and healthy diet for prevention of chronic illness and/or complications. Preventive guidelines reviewed. Vaccination up to date. She needs a new referral to see gynecologist. Next CPE in a year.

## 2023-10-23 NOTE — Assessment & Plan Note (Signed)
Topical steroids not longer helping. I think she will benefit from biologic agents. Dermatology referral placed.

## 2023-10-23 NOTE — Assessment & Plan Note (Signed)
We discussed possible complications if not adequately treated. She agrees with new evaluation, pulmonology referral placed. Weight loss will help.

## 2023-10-23 NOTE — Assessment & Plan Note (Signed)
Following with neurologist.

## 2023-10-23 NOTE — Progress Notes (Signed)
 HPI: Brooke Alexander is a 38 y.o. female with a PMHx significant for OSA, seizure, dyslipidemia, and neck and back pain, who is here today complaining of weight gain.   Exercise: Patient admits she has not been exercising.  Diet: She eats out for three meals per week and has vegetables daily.  Sleep: 8-9 hours per night.  Alcohol Use: occasionally Smoking: never Vision: UTD on routine vision care. She doesn't wear glasses.  Dental: UTD on routine dental care.   LMP: yesterday She doesn't currently have a gynecologist.   Health Maintenance  Topic Date Due   COVID-19 Vaccine (1 - 2024-25 season) 11/08/2023*   Flu Shot  11/30/2023*   Pap with HPV screening  01/20/2024*   HIV Screening  10/22/2024*   DTaP/Tdap/Td vaccine (2 - Td or Tdap) 03/22/2027   Hepatitis C Screening  Completed   HPV Vaccine  Aged Out  *Topic was postponed. The date shown is not the original due date.   Immunization History  Administered Date(s) Administered   Tdap 03/21/2017   Chronic medical problems:   HLD: On non pharmacologic treatment. Lab Results  Component Value Date   CHOL 190 03/07/2022   HDL 53 03/07/2022   LDLCALC 117 (H) 03/07/2022   TRIG 95 03/07/2022   CHOLHDL 3.6 03/07/2022   Fatigue:  She says she often doesn't feel rested even though she sleeps 8-9 hours per night.  She has a hx of OSA but does not use a CPAP because she was uncomfortable sleeping with it.  Last sleep study was 5-6 years ago.   Seizures:  Currently on Keppra [phone] mg twice daily and Topamax 100 mg twice daily.  She follows with neurologist.   Concerns today:   Concerned about wt gain. She was prescribed metformin for weight loss by a provider from an app in 04/2023, but stopped because she wasn't feeling well.   Eczema:  She has a hx of eczema, and complains of a current rash on her hands.  Normally, she uses cortisone cream and it works very well, but it is not helping with her hands.   Review of  Systems  Constitutional:  Positive for fatigue. Negative for activity change, appetite change and fever.  HENT:  Negative for mouth sores, sore throat and trouble swallowing.   Eyes:  Negative for redness and visual disturbance.  Respiratory:  Negative for cough, shortness of breath and wheezing.   Cardiovascular:  Negative for chest pain and leg swelling.  Gastrointestinal:  Negative for abdominal pain, nausea and vomiting.  Endocrine: Negative for cold intolerance, heat intolerance, polydipsia, polyphagia and polyuria.  Genitourinary:  Negative for decreased urine volume, dysuria and hematuria.  Musculoskeletal:  Negative for gait problem and myalgias.  Skin:  Negative for color change and rash.  Allergic/Immunologic: Negative for environmental allergies.  Neurological:  Negative for syncope, weakness and headaches.  Hematological:  Negative for adenopathy. Does not bruise/bleed easily.  Psychiatric/Behavioral:  Negative for confusion and hallucinations.   All other systems reviewed and are negative.  Current Outpatient Medications on File Prior to Visit  Medication Sig Dispense Refill   levETIRAcetam (KEPPRA) 500 MG tablet Take 500-1,000 mg by mouth 2 (two) times daily.     topiramate (TOPAMAX) 50 MG tablet Take 2 tablets by mouth twice daily     benzonatate (TESSALON) 200 MG capsule Take 1 capsule (200 mg total) by mouth 3 (three) times daily as needed. (Patient not taking: Reported on 10/23/2023) 20 capsule 0   metFORMIN (GLUCOPHAGE-XR)  500 MG 24 hr tablet Take 500 mg by mouth 2 (two) times daily. (Patient not taking: Reported on 10/23/2023)     No current facility-administered medications on file prior to visit.   Past Medical History:  Diagnosis Date   Seizures (HCC)    "last seizure 2015"   Sleep apnea    No Known Allergies  Social History   Socioeconomic History   Marital status: Single    Spouse name: Not on file   Number of children: Not on file   Years of education:  Not on file   Highest education level: GED or equivalent  Occupational History   Not on file  Tobacco Use   Smoking status: Never   Smokeless tobacco: Never  Vaping Use   Vaping status: Never Used  Substance and Sexual Activity   Alcohol use: No   Drug use: No   Sexual activity: Not Currently  Other Topics Concern   Not on file  Social History Narrative   Not on file   Social Drivers of Health   Financial Resource Strain: Patient Declined (10/23/2023)   Overall Financial Resource Strain (CARDIA)    Difficulty of Paying Living Expenses: Patient declined  Food Insecurity: No Food Insecurity (10/23/2023)   Hunger Vital Sign    Worried About Running Out of Food in the Last Year: Never true    Ran Out of Food in the Last Year: Never true  Transportation Needs: No Transportation Needs (10/23/2023)   PRAPARE - Administrator, Civil Service (Medical): No    Lack of Transportation (Non-Medical): No  Physical Activity: Unknown (10/23/2023)   Exercise Vital Sign    Days of Exercise per Week: 0 days    Minutes of Exercise per Session: Not on file  Stress: No Stress Concern Present (10/23/2023)   [employer] of Occupational Health - Occupational Stress Questionnaire    Feeling of Stress : Not at all  Social Connections: Unknown (10/23/2023)   Social Connection and Isolation Panel [NHANES]    Frequency of Communication with Friends and Family: More than three times a week    Frequency of Social Gatherings with Friends and Family: Patient declined    Attends Religious Services: Patient declined    Database administrator or Organizations: No    Attends Engineer, structural: Not on file    Marital Status: Never married    Vitals:   10/23/23 1312  BP: 118/86  Pulse: 88  Resp: 16  Temp: 98.4 F (36.9 C)  SpO2: 97%   Body mass index is 41.9 kg/m. Wt Readings from Last 3 Encounters:  10/23/23 258 lb (117 kg)  03/07/22 240 lb 2 oz (108.9 kg)  09/08/20 239  lb (108.4 kg)   Physical Exam Vitals and nursing note reviewed.  Constitutional:      General: She is not in acute distress.    Appearance: She is well-developed.  HENT:     Head: Normocephalic and atraumatic.     Right Ear: Tympanic membrane, ear canal and external ear normal.     Left Ear: Tympanic membrane, ear canal and external ear normal.     Mouth/Throat:     Mouth: Mucous membranes are moist.     Pharynx: Oropharynx is clear. Uvula midline.  Eyes:     Extraocular Movements: Extraocular movements intact.     Conjunctiva/sclera: Conjunctivae normal.     Pupils: Pupils are equal, round, and reactive to light.  Neck:  Thyroid: No thyroid mass or thyromegaly.  Cardiovascular:     Rate and Rhythm: Normal rate and regular rhythm.     Pulses:          Dorsalis pedis pulses are 2+ on the right side and 2+ on the left side.     Heart sounds: No murmur heard. Pulmonary:     Effort: Pulmonary effort is normal. No respiratory distress.     Breath sounds: Normal breath sounds.  Abdominal:     Palpations: Abdomen is soft. There is no hepatomegaly or mass.     Tenderness: There is no abdominal tenderness.  Genitourinary:    Comments: Deferred to gyn. Musculoskeletal:     Right lower leg: No edema.     Left lower leg: No edema.     Comments: No major deformity or signs of synovitis appreciated.  Lymphadenopathy:     Cervical: No cervical adenopathy.     Upper Body:     Right upper body: No supraclavicular adenopathy.     Left upper body: No supraclavicular adenopathy.  Skin:    General: Skin is warm.     Findings: No erythema or rash.     Comments: lichenified areas around finger and postinflammatory hyperpigmentation changes on dorsum of hands.  Neurological:     General: No focal deficit present.     Mental Status: She is alert and oriented to person, place, and time.     Cranial Nerves: No cranial nerve deficit.     Sensory: No sensory deficit.     Motor: No weakness.      Coordination: Coordination normal.     Gait: Gait normal.     Deep Tendon Reflexes:     Reflex Scores:      Bicep reflexes are 2+ on the right side and 2+ on the left side.      Patellar reflexes are 2+ on the right side and 2+ on the left side. Psychiatric:        Mood and Affect: Mood and affect normal.    ASSESSMENT AND PLAN:  [name] for her routine general medication.   Lab Results  Component Value Date   HGBA1C 5.8 10/23/2023   Lab Results  Component Value Date   CHOL 198 10/23/2023   HDL 43.50 10/23/2023   LDLCALC 133 (H) 10/23/2023   TRIG 108.0 10/23/2023   CHOLHDL 5 10/23/2023   Lab Results  Component Value Date   TSH 0.68 10/23/2023   Lab Results  Component Value Date   NA 137 10/23/2023   CL 105 10/23/2023   K 3.7 10/23/2023   CO2 25 10/23/2023   BUN 11 10/23/2023   CREATININE 0.84 10/23/2023   GFR 89.39 10/23/2023   CALCIUM 9.4 10/23/2023   ALBUMIN 4.3 07/16/2018   GLUCOSE 91 10/23/2023   Routine general medical examination at a health care facility Assessment & Plan: We discussed the importance of regular physical activity and healthy diet for prevention of chronic illness and/or complications. Preventive guidelines reviewed. Vaccination up to date. She needs a new referral to see gynecologist. Next CPE in a year.  Orders: -     Ambulatory referral to Obstetrics / Gynecology  Dyslipidemia (high LDL; low HDL) Assessment & Plan: LDL 124 and HDL 40 in 07/2023. Recommend low-fat diet. Further recommendation will be given according to lab results.  Orders: -     Lipid panel; Future  Class 2 severe obesity due to excess calories with serious  comorbidity and body mass index (BMI) of 38.0 to 38.9 in adult [facility]) Assessment & Plan: She has gained wt since 03/2022. We discussed the benefits of wt loss as well as adverse effects of obesity. Consistency with healthy diet and physical activity encouraged.  Orders: -     Amb  Ref to Medical Weight Management -     Hemoglobin A1c; Future -     TSH; Future -     Basic metabolic panel; Future  Screening for endocrine, metabolic and immunity disorder -     Hemoglobin A1c; Future -     Basic metabolic panel; Future  OSA (obstructive sleep apnea) Assessment & Plan: We discussed possible complications if not adequately treated. She agrees with new evaluation, pulmonology referral placed. Weight loss will help.  Orders: -     Ambulatory referral to Pulmonology  Eczema of both hands Assessment & Plan: Topical steroids not longer helping. I think she will benefit from biologic agents. Dermatology referral placed.  Orders: -     Ambulatory referral to Dermatology  Seizure [facility]) Assessment & Plan: Following with neurologist.   Return in 1 year (on 10/22/2024) for CPE.  I, Suanne Marker, acting as a scribe for Veola Cafaro [place], MD., have documented all relevant documentation on the behalf of Bobbi Yount [place], MD, as directed by  Jung Ingerson [place], MD while in the presence of Catheleen Langhorne [place], MD.   I, Taisley Mordan [place], MD, have reviewed all documentation for this visit. The documentation on 10/23/23 for the exam, diagnosis, procedures, and orders are all accurate and complete.  Biddie Sebek G. [place], MD  [facility]. [place] office.

## 2023-10-23 NOTE — Assessment & Plan Note (Addendum)
She has gained wt since 03/2022. We discussed the benefits of wt loss as well as adverse effects of obesity. Consistency with healthy diet and physical activity encouraged.

## 2023-10-24 ENCOUNTER — Encounter: Payer: Self-pay | Admitting: Family Medicine

## 2023-11-24 ENCOUNTER — Institutional Professional Consult (permissible substitution) (HOSPITAL_BASED_OUTPATIENT_CLINIC_OR_DEPARTMENT_OTHER): Payer: BC Managed Care – PPO | Admitting: Primary Care

## 2023-11-25 ENCOUNTER — Encounter (INDEPENDENT_AMBULATORY_CARE_PROVIDER_SITE_OTHER): Payer: BC Managed Care – PPO | Admitting: Family Medicine

## 2023-12-02 ENCOUNTER — Other Ambulatory Visit (HOSPITAL_COMMUNITY)
Admission: RE | Admit: 2023-12-02 | Discharge: 2023-12-02 | Disposition: A | Discharge status: Home / Self Care | Source: Ambulatory Visit | Attending: Physician Assistant | Admitting: Physician Assistant

## 2023-12-02 ENCOUNTER — Ambulatory Visit: Payer: BC Managed Care – PPO | Admitting: Medical

## 2023-12-02 ENCOUNTER — Encounter: Payer: Self-pay | Admitting: Medical

## 2023-12-02 VITALS — BP 131/88 | HR 84 | Ht 66.0 in | Wt 253.0 lb

## 2023-12-02 DIAGNOSIS — Z01419 Encounter for gynecological examination (general) (routine) without abnormal findings: Secondary | ICD-10-CM | POA: Diagnosis not present

## 2023-12-02 DIAGNOSIS — Z1339 Encounter for screening examination for other mental health and behavioral disorders: Secondary | ICD-10-CM

## 2023-12-02 DIAGNOSIS — Z124 Encounter for screening for malignant neoplasm of cervix: Secondary | ICD-10-CM | POA: Insufficient documentation

## 2023-12-02 DIAGNOSIS — Z7689 Persons encountering health services in other specified circumstances: Secondary | ICD-10-CM

## 2023-12-02 NOTE — Progress Notes (Signed)
  History:  [name] is a 38 y.o. [id number] who presents to clinic today for annual exam. Last pap smear was many years ago and normal. Patient is not sexually active and has never been sexually active.  Periods are regular periods lasting 5 days. She denies vaginal bleeding, discharge, UTI symptoms, GI issues or breast concerns today. She has no GYN concerns.   The following portions of the patient's history were reviewed and updated as appropriate: allergies, current medications, family history, past medical history, social history, past surgical history and problem list.  Review of Systems:  Review of Systems  Constitutional:  Negative for fever and malaise/fatigue.  Gastrointestinal:  Negative for abdominal pain, constipation, diarrhea, nausea and vomiting.  Genitourinary:  Negative for dysuria, frequency and urgency.       Neg - vaginal bleeding, discharge, pelvic pain      Objective:  Physical Exam BP 131/88   Pulse 84   Ht 5\' 6"  (1.676 m)   Wt 253 lb (114.8 kg)   LMP 11/21/2023 (Approximate)   BMI 40.84 kg/m  Physical Exam Vitals and nursing note reviewed. Exam conducted with a chaperone present.  Constitutional:      General: She is not in acute distress.    Appearance: Normal appearance. She is well-developed. She is obese.  HENT:     Head: Normocephalic and atraumatic.  Neck:     Thyroid: No thyromegaly.  Cardiovascular:     Rate and Rhythm: Normal rate and regular rhythm.     Heart sounds: No murmur heard. Pulmonary:     Effort: Pulmonary effort is normal. No respiratory distress.     Breath sounds: Normal breath sounds. No wheezing.  Abdominal:     General: Abdomen is flat. Bowel sounds are normal. There is no distension.     Palpations: Abdomen is soft. There is no mass.     Tenderness: There is no abdominal tenderness. There is no guarding or rebound.  Genitourinary:    General: Normal vulva.     Vagina: No vaginal discharge, erythema, tenderness or  bleeding.     Cervix: No cervical motion tenderness, discharge, friability, lesion, erythema or cervical bleeding.     Uterus: Not enlarged and not tender.      Adnexa:        Right: No mass or tenderness.         Left: No mass or tenderness.       Comments: Significant discomfort with speculum exam, pap smear collected with minimal visualization of the cervix. Musculoskeletal:     Cervical back: Neck supple.  Skin:    General: Skin is warm and dry.     Findings: No erythema.  Neurological:     Mental Status: She is alert and oriented to person, place, and time.  Psychiatric:        Mood and Affect: Mood normal.      Health Maintenance Due  Topic Date Due   COVID-19 Vaccine (1 - 2024-25 season) Never done    Labs, imaging and previous visits in Epic and Care Everywhere reviewed  Assessment & Plan:  1. Encounter to establish care (Primary)  2. Cervical cancer screening - Cytology - PAP( [facility]) - Declined STD testing today    Return in about 1 year (around 12/01/2024) for Annual exam.  Marny Lowenstein, PA-C 12/02/2023 8:32 AM

## 2023-12-02 NOTE — Progress Notes (Signed)
 Pt presents for annual and to est care. Pt is due for her pap. Pt has no questions or concerns at this time.

## 2023-12-03 ENCOUNTER — Encounter (INDEPENDENT_AMBULATORY_CARE_PROVIDER_SITE_OTHER): Payer: Self-pay

## 2023-12-03 LAB — CYTOLOGY - PAP
Comment: NEGATIVE
Diagnosis: NEGATIVE
High risk HPV: NEGATIVE
